# Patient Record
Sex: Male | Born: 1965
Health system: Southern US, Community
[De-identification: ages and names within clinical notes are randomized; demographics above are authoritative.]

## PROBLEM LIST (undated history)

## (undated) DIAGNOSIS — T7840XA Allergy, unspecified, initial encounter: Secondary | ICD-10-CM

## (undated) HISTORY — DX: Allergy, unspecified, initial encounter: T78.40XA

## (undated) HISTORY — PX: WISDOM TOOTH EXTRACTION: SHX21

## (undated) HISTORY — PX: WRIST SURGERY: SHX841

---

## 2002-01-06 ENCOUNTER — Emergency Department (HOSPITAL_COMMUNITY): Admission: EM | Admit: 2002-01-06 | Discharge: 2002-01-06 | Payer: Self-pay | Admitting: Emergency Medicine

## 2004-08-30 ENCOUNTER — Ambulatory Visit: Payer: Self-pay | Admitting: Oncology

## 2009-08-14 ENCOUNTER — Encounter: Payer: Self-pay | Admitting: Family Medicine

## 2009-09-18 ENCOUNTER — Ambulatory Visit: Payer: Self-pay | Admitting: Family Medicine

## 2009-09-18 DIAGNOSIS — IMO0002 Reserved for concepts with insufficient information to code with codable children: Secondary | ICD-10-CM | POA: Insufficient documentation

## 2009-09-23 ENCOUNTER — Telehealth (INDEPENDENT_AMBULATORY_CARE_PROVIDER_SITE_OTHER): Payer: Self-pay | Admitting: *Deleted

## 2009-09-24 ENCOUNTER — Ambulatory Visit: Payer: Self-pay | Admitting: Family Medicine

## 2009-10-16 ENCOUNTER — Telehealth (INDEPENDENT_AMBULATORY_CARE_PROVIDER_SITE_OTHER): Payer: Self-pay | Admitting: *Deleted

## 2009-10-16 ENCOUNTER — Telehealth: Payer: Self-pay | Admitting: Family Medicine

## 2009-10-19 ENCOUNTER — Encounter (INDEPENDENT_AMBULATORY_CARE_PROVIDER_SITE_OTHER): Payer: Self-pay | Admitting: *Deleted

## 2010-04-13 NOTE — Assessment & Plan Note (Signed)
Summary: new to estab/back pain/cbs   Vital Signs:  Patient profile:   45 year old male Height:      71 inches Weight:      188 pounds BMI:     26.32 Temp:     98.6 degrees F oral Pulse rate:   62 / minute Resp:     18 per minute BP sitting:   110 / 82  (left arm)  Vitals Entered By: Jeremy Johann CMA (September 18, 2009 10:26 AM) CC: new to establish, lower back pain x46month, Back Pain Comments REVIEWED MED LIST, PATIENT AGREED DOSE AND INSTRUCTION CORRECT    History of Present Illness:       This is a 45 year old man who presents with Back Pain.  The symptoms began 1 month ago.  Pt states he woke up with back pain June 2,2011.  No known injury.  Pt went to [facility] on [address] on June 3rd.  Pt went back 4-5 days later and PT recommended.  Pt has been going to PT.   Pt going to PT in [place]-- [facility].   The patient denies fever, chills, weakness, loss of sensation, fecal incontinence, urinary incontinence, urinary retention, dysuria, rest pain, inability to work, and inability to care for self.  The pain is located in the mid low back.  The pain began at home and suddenly.  The pain radiates to the right leg below the knee and left leg below the knee.  The pain is made worse by lying down.  The pain is made better by inactivity, muscle relaxants, and opioids.  Risk factors for serious underlying conditions include duration of pain > 1 month.    Preventive Screening-Counseling & Management  Alcohol-Tobacco     Smoking Status: never  Caffeine-Diet-Exercise     Does Patient Exercise: no  Current Medications (verified): 1)  Ibuprofen 800 Mg Tabs (Ibuprofen) .Marland Kitchen.. 1 By Mouth Every 6 Hours 2)  Vicodin 5-500 Mg Tabs (Hydrocodone-Acetaminophen) .Marland Kitchen.. 1 By Mouth Every 6 Hours As Needed 3)  Flexeril 10 Mg Tabs (Cyclobenzaprine Hcl) .Marland Kitchen.. 1 By Mouth Three Times A Day  Allergies (verified): No Known Drug Allergies  Past History:  Past medical, surgical, family and social  histories (including risk factors) reviewed for relevance to current acute and chronic problems.  Past Medical History: none  Past Surgical History: none  Family History: Reviewed history and no changes required. Family History Diabetes 1st degree relative--PGM HTN-PGM Prostate CA --maternal uncle  Social History: Reviewed history and no changes required. Married Never Smoked Alcohol use-yes Regular exercise-no Occupation: [employer] Smoking Status:  never Does Patient Exercise:  no Occupation:  employed  Review of Systems      See HPI  Physical Exam  General:  Well-developed,well-nourished,in no acute distress; alert,appropriate and cooperative throughout examination Abdomen:  Bowel sounds positive,abdomen soft and non-tender without masses, organomegaly or hernias noted. Msk:  Pain with forward bending 10-20 degrees pain with back bending + slr b/l R> L Extremities:  No clubbing, cyanosis, edema, or deformity noted with normal full range of motion of all joints.   Neurologic:  strength normal in all extremities, gait normal, and DTRs symmetrical and normal.   Psych:  Oriented X3 and normally interactive.     Impression & Recommendations:  Problem # 1:  BACK PAIN WITH RADICULOPATHY (ICD-729.2) [facility] con't  pain meds Orders: Radiology Referral (Radiology)  MRI rto as needed  Complete Medication List: 1)  Ibuprofen 800 Mg Tabs (Ibuprofen) .Marland Kitchen.. 1 by mouth every 6 hours 2)  Vicodin 5-500 Mg Tabs (Hydrocodone-acetaminophen) .Marland Kitchen.. 1 by mouth every 6 hours as needed 3)  Flexeril 10 Mg Tabs (Cyclobenzaprine hcl) .Marland Kitchen.. 1 by mouth three times a day Prescriptions: VICODIN 5-500 MG TABS (HYDROCODONE-ACETAMINOPHEN) 1 by mouth every 6 hours as needed  #30 x 0   Entered and Authorized by:   Loreen Freud DO   Signed by:   Loreen Freud DO on 09/18/2009   Method used:   Print then Give to Patient   RxID:   [id number]

## 2010-04-13 NOTE — Letter (Signed)
Summary: [facility]  [facility]   Imported By: Lanelle Bal 09/25/2009 09:58:46  _____________________________________________________________________  External Attachment:    Type:   Image     Comment:   External Document

## 2010-04-13 NOTE — Progress Notes (Signed)
Summary: REFILL REQUEST  Phone Note Refill Request Message from:  Patient on October 16, 2009 3:47 PM  Refills Requested: Medication #1:  PERCOCET 5-325 MG TABS 1-2 by mouth every 6 hours prn   Dosage confirmed as above?Dosage Confirmed   Supply Requested: 1 month   Last Refilled: 09/24/2009 PT WALKS IN AND WANTS A RX FOR HIS PERCOCET. PT IS UNABLE TO RECEIVE RX AT THIS TIME BECAUSE IT WAS FILLED ON  7/14. PT WILL COME BACK NEXT WEEK FOR RX.   Method Requested: Pick up at Office Next Appointment Scheduled: NONE Initial call taken by: Lavell Islam,  October 16, 2009 3:51 PM  Follow-up for Phone Call        last filled,last OV 09-24-09 #60.................Marland KitchenFelecia Deloach CMA  October 16, 2009 4:07 PM   Additional Follow-up for Phone Call Additional follow up Details #1::        too early--- we can fill next week Additional Follow-up by: Loreen Freud DO,  October 16, 2009 4:16 PM

## 2010-04-13 NOTE — Assessment & Plan Note (Signed)
Summary: fill out fmla/cbs   Vital Signs:  Patient profile:   45 year old male Height:      71 inches Weight:      187 pounds Temp:     97.9 degrees F oral Pulse rate:   68 / minute BP sitting:   110 / 78  (left arm)  Vitals Entered By: Jeremy Johann CMA (September 24, 2009 3:44 PM) CC: fill out forms Comments REVIEWED MED LIST, PATIENT AGREED DOSE AND INSTRUCTION CORRECT    History of Present Illness: Pt here f/u backpain.   He is no better. He is going to PT and MRI was refused.  Pt needs paperwork filled out for work.  Current Medications (verified): 1)  Ibuprofen 800 Mg Tabs (Ibuprofen) .Marland Kitchen.. 1 By Mouth Every 6 Hours 2)  Percocet 5-325 Mg Tabs (Oxycodone-Acetaminophen) .Marland Kitchen.. 1-2 By Mouth Every 6 Hours Prn 3)  Flexeril 10 Mg Tabs (Cyclobenzaprine Hcl) .Marland Kitchen.. 1 By Mouth Three Times A Day  Allergies (verified): No Known Drug Allergies  Family History: Reviewed history from 09/18/2009 and no changes required. Family History Diabetes 1st degree relative--PGM HTN-PGM Prostate CA --maternal uncle  Social History: Reviewed history from 09/18/2009 and no changes required. Married Never Smoked Alcohol use-yes Regular exercise-no Occupation: [employer]  Review of Systems      See HPI  Physical Exam  General:  Well-developed,well-nourished,in no acute distress; alert,appropriate and cooperative throughout examination Psych:  Cognition and judgment appear intact. Alert and cooperative with normal attention span and concentration. No apparent delusions, illusions, hallucinations   Impression & Recommendations:  Problem # 1:  BACK PAIN WITH RADICULOPATHY (ICD-729.2) percocet and flexeril con't pt  Orders: Orthopedic Surgeon Referral (Ortho Surgeon) disability forms filled out for pt  Complete Medication List: 1)  Ibuprofen 800 Mg Tabs (Ibuprofen) .Marland Kitchen.. 1 by mouth every 6 hours 2)  Percocet 5-325 Mg Tabs (Oxycodone-acetaminophen) .Marland Kitchen.. 1-2 by mouth every 6 hours prn 3)   Flexeril 10 Mg Tabs (Cyclobenzaprine hcl) .Marland Kitchen.. 1 by mouth three times a day  Patient Instructions: 1)  Most patients (90%) with low back pain will improve with time ( 2-6 weeks). Keep active but avoid activities that are painful. Apply moist heat and/or ice to lower back several times a day.  Prescriptions: PERCOCET 5-325 MG TABS (OXYCODONE-ACETAMINOPHEN) 1-2 by mouth every 6 hours prn  #60 x 0   Entered and Authorized by:   Loreen Freud DO   Signed by:   Loreen Freud DO on 09/24/2009   Method used:   Print then Give to Patient   RxID:   [id number] FLEXERIL 10 MG TABS (CYCLOBENZAPRINE HCL) 1 by mouth three times a day  #60 x 1   Entered and Authorized by:   Loreen Freud DO   Signed by:   Loreen Freud DO on 09/24/2009   Method used:   Print then Give to Patient   RxID:   [id number]

## 2010-04-13 NOTE — Letter (Signed)
Summary: Out of Work  [facility]  [address] [place], [place] [address]   Phone: [phone]  Fax: [phone]    October 19, 2009   Employee:  Reyne Dumas    To Whom It May Concern:   For Medical reasons, please excuse the above named employee from work for the following dates:  Start:   10-19-09  End:   10-22-09  If you need additional information, please feel free to contact our office.         Sincerely,       Janit Bern

## 2010-04-13 NOTE — Progress Notes (Signed)
Summary: lmtc 8-5 WORK??  Phone Note Call from Patient   Caller: Patient Summary of Call: PT WALKS IN AND STATES THAT HE IS SCHEDULED TO GO BACK TO WORK ON MONDAY. PT STATES HE HASN'T BEEN TO HIS APPT YET WITH THE ORTHO SURGEON. PT HAS AN APPT WITH ORTHO  ON 8/11. HE DOESN'T KNOW IF HE SHOULD GO BACK TO WORK SINCE NOTHING HAS CHANGED OR IF HE NEEDS TO WAIT.  PLEASE ADVISE WHAT HE SHOULD DO. [phone] Initial call taken by: Lavell Islam,  October 16, 2009 3:56 PM  Follow-up for Phone Call        dr lowne pls advise.................Marland KitchenFelecia Deloach CMA  October 16, 2009 4:03 PM   Additional Follow-up for Phone Call Additional follow up Details #1::        extend note until 8/11 and he will have to get note from ortho then.   Additional Follow-up by: Loreen Freud DO,  October 16, 2009 4:15 PM    Additional Follow-up for Phone Call Additional follow up Details #2::    left message to call office.................Marland KitchenFelecia Deloach CMA  October 16, 2009 4:27 PM   DISCUSS WITH PATIENT................Marland KitchenFelecia Deloach CMA  October 19, 2009 12:27 PM

## 2010-04-13 NOTE — Progress Notes (Signed)
Summary: MRI Denial  Phone Note Other Incoming   Caller: Fax from [facility] Summary of Call: [facility] denied the MRI Lumbar Spine without contrast due to the lack of  physcian treatment without results for at least 6 weeks. Will inform patient of this and see if he would like the ortho referral instead.  Initial call taken by: Harold Barban,  September 23, 2009 10:36 AM  Follow-up for Phone Call        [email or web address].Harold Barban  September 23, 2009 10:39 AM  Additional Follow-up for Phone Call Additional follow up Details #1::        Spoke with patient and offered they ortho referral or to come back and see Dr. Laury Axon. He said he would think about it and will let Dr. Laury Axon know at his appt this afternoon to go over his FMLA papers.  Additional Follow-up by: Harold Barban,  September 24, 2009 9:46 AM

## 2011-07-18 ENCOUNTER — Ambulatory Visit: Payer: Managed Care, Other (non HMO) | Admitting: Internal Medicine

## 2011-07-18 VITALS — BP 136/89 | HR 79 | Temp 99.3°F | Resp 16 | Ht 70.0 in | Wt 186.0 lb

## 2011-07-18 DIAGNOSIS — J029 Acute pharyngitis, unspecified: Secondary | ICD-10-CM

## 2011-07-18 NOTE — Progress Notes (Signed)
  Subjective:    Patient ID: William Lawrence, male    DOB: 1965-10-19, 46 y.o.   MRN: [id number]  HPIComplaining of scratchy sore throat with postnasal drainage for 2 days Did not respond to one dose of Claritin Doesn't like to take medications No cough or wheezing Not really sneezing No fever    Review of Systems     Objective:   Physical Exam Conjunctiva slightly injected TMs clear Nares boggy with clear mucus Throat injected with no purulent mucus No a.c. Or PC nodes Chest clear       Results for orders placed in visit on 07/18/11  POCT RAPID STREP A (OFFICE)      Component Value Range   Rapid Strep A Screen Negative  Negative     Assessment & Plan:  Problem #1 pharyngitis Problem #2 allergic rhinitis  Could all be viral or allergic Of all the options he chooses to try Zyrtec without anything else Followup as needed

## 2012-01-24 ENCOUNTER — Encounter: Payer: Self-pay | Admitting: Family Medicine

## 2012-01-24 ENCOUNTER — Ambulatory Visit (INDEPENDENT_AMBULATORY_CARE_PROVIDER_SITE_OTHER): Payer: 59 | Admitting: Family Medicine

## 2012-01-24 VITALS — BP 130/82 | HR 97 | Temp 98.5°F | Wt 190.4 lb

## 2012-01-24 DIAGNOSIS — J029 Acute pharyngitis, unspecified: Secondary | ICD-10-CM

## 2012-01-24 MED ORDER — AMOXICILLIN 875 MG PO TABS: 875.0000 mg | ORAL_TABLET | Freq: Two times a day (BID) | ORAL | Status: DC

## 2012-01-24 NOTE — Patient Instructions (Addendum)

## 2012-01-24 NOTE — Progress Notes (Signed)
  Subjective:     William Lawrence is a 46 y.o. male who presents for evaluation of sore throat. Associated symptoms include nasal blockage, post nasal drip, sinus and nasal congestion and sore throat. Onset of symptoms was 3 days ago, and have been gradually worsening since that time. He is drinking plenty of fluids. He has not had a recent close exposure to someone with proven streptococcal pharyngitis.  The following portions of the patient's history were reviewed and updated as appropriate: allergies, current medications, past family history, past medical history, past social history, past surgical history and problem list.  Review of Systems Pertinent items are noted in HPI.    Objective:    BP 130/82  Pulse 97  Temp 98.5 F (36.9 C) (Oral)  Wt 190 lb 6.4 oz (86.365 kg)  SpO2 97% General appearance: alert, cooperative, appears stated age and no distress Ears: normal TM's and external ear canals both ears Nose: Nares normal. Septum midline. Mucosa normal. No drainage or sinus tenderness. Throat: abnormal findings: marked oropharyngeal erythema and + exudate Neck: mild anterior cervical adenopathy, supple, symmetrical, trachea midline and thyroid not enlarged, symmetric, no tenderness/mass/nodules Lungs: clear to auscultation bilaterally Heart: S1, S2 normal  Laboratory Strep test done. Results:negative.    Assessment:    Acute pharyngitis---throat culture done -.    Plan:    Patient placed on antibiotics. Use of OTC analgesics recommended as well as salt water gargles. Patient advised of the risk of peritonsillar abscess formation. Follow up as needed.

## 2012-01-26 LAB — CULTURE, GROUP A STREP

## 2017-02-28 NOTE — Progress Notes (Signed)
[facility] [address] [place], [place] [address] [phone]  Date:  03/01/2017   Name:  William Lawrence   DOB:  Aug 23, 1965   MRN:  [id number]  PCP:  Ann Held, DO    Chief Complaint: No chief complaint on file.   History of Present Illness:  William Lawrence is a 51 y.o. very pleasant male patient who presents with the following:  Here today for a sick visit- she has seen Dr. Etter Sjogren but it has been 5 years since he was last seen by [place]. This pt was not put in as a new patient visit Today is Monday-  He noted a ST that started 10 days ago.  He used some nyquil and got better for a while, but this past Friday he went to [facility] and was seen.  He had a strep which was negative.   They also did a throat culture which was negative He notes that he continues to have some ST at night and in the early am He will have a "horrible" cough at night However he feels like the cough is coming more from his throat than his chest  He is not having any current fever or chills, but states that he did have a fever a few days ago No vomiting or diarrhea He is otherwise generally in good health  No sick contacts   Patient Active Problem List   Diagnosis Date Noted  . BACK PAIN WITH RADICULOPATHY 09/18/2009    No past medical history on file.  No past surgical history on file.  Social History   Tobacco Use  . Smoking status: Never Smoker  . Smokeless tobacco: Never Used  Substance Use Topics  . Alcohol use: Not on file  . Drug use: Not on file    Family History  Problem Relation Age of Onset  . Diabetes Paternal Grandmother   . Hypertension Paternal Grandmother   . Prostate cancer Maternal Uncle     No Known Allergies  Medication list has been reviewed and updated.  Current Outpatient Medications on File Prior to Visit  Medication Sig Dispense Refill  . fish oil-omega-3 fatty acids 1000 MG capsule Take 2 g  by mouth daily.    . Multiple Vitamin (MULTIVITAMIN) tablet Take 1 tablet by mouth daily.     No current facility-administered medications on file prior to visit.     Review of Systems:  As per HPI- otherwise negative. No fever or chills Pt notes that his BP is normally ok No sick contacts    Physical Examination: Vitals:   03/01/17 0849  BP: (!) 143/90  Pulse: 65  Resp: 18  Temp: 98.6 F (37 C)  SpO2: 97%   Vitals:   03/01/17 0849  Weight: 186 lb (84.4 kg)   Body mass index is 26.69 kg/m. Ideal Body Weight:    GEN: WDWN, NAD, Non-toxic, A & O x 3, looks well HEENT: Atraumatic, Normocephalic. Neck supple. No masses, No LAD.  Bilateral TM wnl, oropharynx normal.  Nasal cavity is inflamed and red.  PEERL,EOMI.   Ears and Nose: No external deformity. CV: RRR, No M/G/R. No JVD. No thrill. No extra heart sounds. PULM: CTA B, no wheezes, crackles, rhonchi. No retractions. No resp. distress. No accessory muscle use. ABD: S, NT, ND EXTR: No c/c/e NEURO Normal gait.  PSYCH: Normally interactive. Conversant. Not depressed or anxious appearing.  Calm demeanor.  Assessment and Plan: Pharyngitis, unspecified etiology - Plan: amoxicillin (AMOXIL) 500 MG capsule  Acute non-recurrent frontal sinusitis - Plan: amoxicillin (AMOXIL) 500 MG capsule  Here today for sick visit- new patient but was not put into a new visit slot.  Will treat him with amoxicillin for persistent pharyngitis and sinusitis He will let me know if not feeling better in the next few day- Sooner if worse.    Signed Lamar Blinks, MD

## 2017-03-01 ENCOUNTER — Encounter: Payer: Self-pay | Admitting: Family Medicine

## 2017-03-01 ENCOUNTER — Ambulatory Visit (INDEPENDENT_AMBULATORY_CARE_PROVIDER_SITE_OTHER): Payer: 59 | Admitting: Family Medicine

## 2017-03-01 VITALS — BP 143/90 | HR 65 | Temp 98.6°F | Resp 18 | Wt 186.0 lb

## 2017-03-01 DIAGNOSIS — J0110 Acute frontal sinusitis, unspecified: Secondary | ICD-10-CM

## 2017-03-01 DIAGNOSIS — J029 Acute pharyngitis, unspecified: Secondary | ICD-10-CM

## 2017-03-01 MED ORDER — AMOXICILLIN 500 MG PO CAPS: 1000.0000 mg | ORAL_CAPSULE | Freq: Two times a day (BID) | ORAL | 0 refills | Status: DC

## 2017-03-01 NOTE — Patient Instructions (Signed)
We are going to treat your sore throat and sinus infection with amoxicillin- use this as directed and let me know if you are not feeling better in the next few days- Sooner if worse.

## 2017-05-05 NOTE — Progress Notes (Signed)
Chief Complaint  Patient presents with  . Cough    HPI: William Lawrence 52 y.o. Patient comes in today for [facility] Saturday clinic for  new problem evaluation. Late  Thursday or yesterday .   Coughing.  Not feeling  That well and pressure in face.   took nyquil last night    Had chills with shakes last night for about 15 minutes   No lung  Disease  Asthma alergies .  ROS: See pertinent positives and negatives per HPI. No cp sob  Cough is dry . No hx of this  Or sig illness  .   Neg tobacco  Back myalgias no pleurisy   Children in home had flu a month ago?  No flu vaccine   No past medical history on file.  Family History  Problem Relation Age of Onset  . Diabetes Paternal Grandmother   . Hypertension Paternal Grandmother   . Prostate cancer Maternal Uncle     Social History   Socioeconomic History  . Marital status: Married    Spouse name: None  . Number of children: None  . Years of education: None  . Highest education level: None  Social Needs  . Financial resource strain: None  . Food insecurity - worry: None  . Food insecurity - inability: None  . Transportation needs - medical: None  . Transportation needs - non-medical: None  Occupational History  . None  Tobacco Use  . Smoking status: Never Smoker  . Smokeless tobacco: Never Used  Substance and Sexual Activity  . Alcohol use: None  . Drug use: None  . Sexual activity: None  Other Topics Concern  . None  Social History Narrative  . None    Outpatient Medications Prior to Visit  Medication Sig Dispense Refill  . amoxicillin (AMOXIL) 500 MG capsule Take 2 capsules (1,000 mg total) by mouth 2 (two) times daily. (Patient not taking: Reported on 05/06/2017) 40 capsule 0  . fish oil-omega-3 fatty acids 1000 MG capsule Take 2 g by mouth daily.    . Multiple Vitamin (MULTIVITAMIN) tablet Take 1 tablet by mouth daily.     No facility-administered medications prior to visit.      EXAM:  BP 110/76   Pulse 90    Temp 100 F (37.8 C) (Oral)   Wt 182 lb 1.9 oz (82.6 kg)   SpO2 98%   BMI 26.13 kg/m   Body mass index is 26.13 kg/m. WDWN in NAD  quiet respirations; mildly congested   ocass cough . Non toxic . HEENT: Normocephalic ;atraumatic , Eyes;  PERRL, EOMs  Full, lids and conjunctiva clear,,Ears: no deformities, canals nl, TM landmarks normal, Nose: no deformity or discharge but congested;face minimally tender Mouth : OP clear without lesion or edema .  Redness  Neck: Supple without adenopathy or masses or bruits Chest:  Clear to A without wheezes rales or rhonchi CV:  S1-S2 no gallops or murmurs peripheral perfusion is normal Skin :nl perfusion and no acute rashes   No results found for: WBC, HGB, HCT, PLT, GLUCOSE, CHOL, TRIG, HDL, LDLDIRECT, LDLCALC, ALT, AST, NA, K, CL, CREATININE, BUN, CO2, TSH, PSA, INR, GLUF, HGBA1C, MICROALBUR BP Readings from Last 3 Encounters:  05/06/17 110/76  03/01/17 (!) 143/90  01/24/12 130/82    ASSESSMENT AND PLAN:  Discussed the following assessment and plan:  Flu-like symptoms - Plan: POC Influenza A&B(BINAX/QUICKVUE)  Cough with fever - Plan: POC Influenza A&B(BINAX/QUICKVUE) No evidence of  pna today  Risk benefit of medication discussed.   suspect false negative  Flu screen .   Disc  signs of pna complications   dont work today .  Fu if fever  Continues or cp sob  Counseled   Expectant management.     Fu with alarm sx  -Patient advised to return or notify health care team  if  new concerns arise.  Patient Instructions  This acts like flu    Your flu test is negative    But still could be flu  Chest exam is clear today    And oxygen level is good .   We can use tamiflu   If fever not improving in the next  2 days  Or short po breath contact  For more evaluation chest x ray poss etc.    mucinex and mucinex Dm      Influenza, Adult Influenza, more commonly known as "the flu," is a viral infection that primarily affects the respiratory tract.  The respiratory tract includes organs that help you breathe, such as the lungs, nose, and throat. The flu causes many common cold symptoms, as well as a high fever and body aches. The flu spreads easily from person to person (is contagious). Getting a flu shot (influenza vaccination) every year is the best way to prevent influenza. What are the causes? Influenza is caused by a virus. You can catch the virus by:  Breathing in droplets from an infected person's cough or sneeze.  Touching something that was recently contaminated with the virus and then touching your mouth, nose, or eyes.  What increases the risk? The following factors may make you more likely to get the flu:  Not cleaning your hands frequently with soap and water or alcohol-based hand sanitizer.  Having close contact with many people during cold and flu season.  Touching your mouth, eyes, or nose without washing or sanitizing your hands first.  Not drinking enough fluids or not eating a healthy diet.  Not getting enough sleep or exercise.  Being under a high amount of stress.  Not getting a yearly (annual) flu shot.  You may be at a higher risk of complications from the flu, such as a severe lung infection (pneumonia), if you:  Are over the age of 30.  Are pregnant.  Have a weakened disease-fighting system (immune system). You may have a weakened immune system if you: ? Have HIV or AIDS. ? Are undergoing chemotherapy. ? Aretaking medicines that reduce the activity of (suppress) the immune system.  Have a long-term (chronic) illness, such as heart disease, kidney disease, diabetes, or lung disease.  Have a liver disorder.  Are obese.  Have anemia.  What are the signs or symptoms? Symptoms of this condition typically last 4-10 days and may include:  Fever.  Chills.  Headache, body aches, or muscle aches.  Sore throat.  Cough.  Runny or congested nose.  Chest discomfort and cough.  Poor  appetite.  Weakness or tiredness (fatigue).  Dizziness.  Nausea or vomiting.  How is this diagnosed? This condition may be diagnosed based on your medical history and a physical exam. Your health care provider may do a nose or throat swab test to confirm the diagnosis. How is this treated? If influenza is detected early, you can be treated with antiviral medicine that can reduce the length of your illness and the severity of your symptoms. This medicine may be given by mouth (orally) or through an IV tube that is  inserted in one of your veins. The goal of treatment is to relieve symptoms by taking care of yourself at home. This may include taking over-the-counter medicines, drinking plenty of fluids, and adding humidity to the air in your home. In some cases, influenza goes away on its own. Severe influenza or complications from influenza may be treated in a hospital. Follow these instructions at home:  Take over-the-counter and prescription medicines only as told by your health care provider.  Use a cool mist humidifier to add humidity to the air in your home. This can make breathing easier.  Rest as needed.  Drink enough fluid to keep your urine clear or pale yellow.  Cover your mouth and nose when you cough or sneeze.  Wash your hands with soap and water often, especially after you cough or sneeze. If soap and water are not available, use hand sanitizer.  Stay home from work or school as told by your health care provider. Unless you are visiting your health care provider, try to avoid leaving home until your fever has been gone for 24 hours without the use of medicine.  Keep all follow-up visits as told by your health care provider. This is important. How is this prevented?  Getting an annual flu shot is the best way to avoid getting the flu. You may get the flu shot in late summer, fall, or winter. Ask your health care provider when you should get your flu shot.  Wash your  hands often or use hand sanitizer often.  Avoid contact with people who are sick during cold and flu season.  Eat a healthy diet, drink plenty of fluids, get enough sleep, and exercise regularly. Contact a health care provider if:  You develop new symptoms.  You have: ? Chest pain. ? Diarrhea. ? A fever.  Your cough gets worse.  You produce more mucus.  You feel nauseous or you vomit. Get help right away if:  You develop shortness of breath or difficulty breathing.  Your skin or nails turn a bluish color.  You have severe pain or stiffness in your neck.  You develop a sudden headache or sudden pain in your face or ear.  You cannot stop vomiting. This information is not intended to replace advice given to you by your health care provider. Make sure you discuss any questions you have with your health care provider. Document Released: 02/26/2000 Document Revised: 08/06/2015 Document Reviewed: 12/23/2014 Elsevier Interactive Patient Education  2017 Elliston K. Panosh M.D.

## 2017-05-06 ENCOUNTER — Ambulatory Visit (INDEPENDENT_AMBULATORY_CARE_PROVIDER_SITE_OTHER): Payer: 59 | Admitting: Internal Medicine

## 2017-05-06 ENCOUNTER — Encounter: Payer: Self-pay | Admitting: Internal Medicine

## 2017-05-06 VITALS — BP 110/76 | HR 90 | Temp 100.0°F | Wt 182.1 lb

## 2017-05-06 DIAGNOSIS — R05 Cough: Secondary | ICD-10-CM | POA: Diagnosis not present

## 2017-05-06 DIAGNOSIS — R6889 Other general symptoms and signs: Secondary | ICD-10-CM

## 2017-05-06 DIAGNOSIS — R509 Fever, unspecified: Secondary | ICD-10-CM | POA: Diagnosis not present

## 2017-05-06 DIAGNOSIS — R059 Cough, unspecified: Secondary | ICD-10-CM

## 2017-05-06 LAB — POC INFLUENZA A&B (BINAX/QUICKVUE)
INFLUENZA A, POC: NEGATIVE
INFLUENZA B, POC: NEGATIVE

## 2017-05-06 MED ORDER — OSELTAMIVIR PHOSPHATE 75 MG PO CAPS: 75.0000 mg | ORAL_CAPSULE | Freq: Two times a day (BID) | ORAL | 0 refills | Status: DC

## 2017-05-06 NOTE — Patient Instructions (Addendum)
This acts like flu    Your flu test is negative    But still could be flu  Chest exam is clear today    And oxygen level is good .   We can use tamiflu   If fever not improving in the next  2 days  Or short po breath contact  For more evaluation chest x ray poss etc.    mucinex and mucinex Dm      Influenza, Adult Influenza, more commonly known as "the flu," is a viral infection that primarily affects the respiratory tract. The respiratory tract includes organs that help you breathe, such as the lungs, nose, and throat. The flu causes many common cold symptoms, as well as a high fever and body aches. The flu spreads easily from person to person (is contagious). Getting a flu shot (influenza vaccination) every year is the best way to prevent influenza. What are the causes? Influenza is caused by a virus. You can catch the virus by:  Breathing in droplets from an infected person's cough or sneeze.  Touching something that was recently contaminated with the virus and then touching your mouth, nose, or eyes.  What increases the risk? The following factors may make you more likely to get the flu:  Not cleaning your hands frequently with soap and water or alcohol-based hand sanitizer.  Having close contact with many people during cold and flu season.  Touching your mouth, eyes, or nose without washing or sanitizing your hands first.  Not drinking enough fluids or not eating a healthy diet.  Not getting enough sleep or exercise.  Being under a high amount of stress.  Not getting a yearly (annual) flu shot.  You may be at a higher risk of complications from the flu, such as a severe lung infection (pneumonia), if you:  Are over the age of 44.  Are pregnant.  Have a weakened disease-fighting system (immune system). You may have a weakened immune system if you: ? Have HIV or AIDS. ? Are undergoing chemotherapy. ? Aretaking medicines that reduce the activity of (suppress) the  immune system.  Have a long-term (chronic) illness, such as heart disease, kidney disease, diabetes, or lung disease.  Have a liver disorder.  Are obese.  Have anemia.  What are the signs or symptoms? Symptoms of this condition typically last 4-10 days and may include:  Fever.  Chills.  Headache, body aches, or muscle aches.  Sore throat.  Cough.  Runny or congested nose.  Chest discomfort and cough.  Poor appetite.  Weakness or tiredness (fatigue).  Dizziness.  Nausea or vomiting.  How is this diagnosed? This condition may be diagnosed based on your medical history and a physical exam. Your health care provider may do a nose or throat swab test to confirm the diagnosis. How is this treated? If influenza is detected early, you can be treated with antiviral medicine that can reduce the length of your illness and the severity of your symptoms. This medicine may be given by mouth (orally) or through an IV tube that is inserted in one of your veins. The goal of treatment is to relieve symptoms by taking care of yourself at home. This may include taking over-the-counter medicines, drinking plenty of fluids, and adding humidity to the air in your home. In some cases, influenza goes away on its own. Severe influenza or complications from influenza may be treated in a hospital. Follow these instructions at home:  Take over-the-counter and prescription medicines  only as told by your health care provider.  Use a cool mist humidifier to add humidity to the air in your home. This can make breathing easier.  Rest as needed.  Drink enough fluid to keep your urine clear or pale yellow.  Cover your mouth and nose when you cough or sneeze.  Wash your hands with soap and water often, especially after you cough or sneeze. If soap and water are not available, use hand sanitizer.  Stay home from work or school as told by your health care provider. Unless you are visiting your health  care provider, try to avoid leaving home until your fever has been gone for 24 hours without the use of medicine.  Keep all follow-up visits as told by your health care provider. This is important. How is this prevented?  Getting an annual flu shot is the best way to avoid getting the flu. You may get the flu shot in late summer, fall, or winter. Ask your health care provider when you should get your flu shot.  Wash your hands often or use hand sanitizer often.  Avoid contact with people who are sick during cold and flu season.  Eat a healthy diet, drink plenty of fluids, get enough sleep, and exercise regularly. Contact a health care provider if:  You develop new symptoms.  You have: ? Chest pain. ? Diarrhea. ? A fever.  Your cough gets worse.  You produce more mucus.  You feel nauseous or you vomit. Get help right away if:  You develop shortness of breath or difficulty breathing.  Your skin or nails turn a bluish color.  You have severe pain or stiffness in your neck.  You develop a sudden headache or sudden pain in your face or ear.  You cannot stop vomiting. This information is not intended to replace advice given to you by your health care provider. Make sure you discuss any questions you have with your health care provider. Document Released: 02/26/2000 Document Revised: 08/06/2015 Document Reviewed: 12/23/2014 Elsevier Interactive Patient Education  2017 [employer].

## 2018-08-10 ENCOUNTER — Ambulatory Visit (HOSPITAL_BASED_OUTPATIENT_CLINIC_OR_DEPARTMENT_OTHER)
Admission: RE | Admit: 2018-08-10 | Discharge: 2018-08-10 | Disposition: A | Discharge status: Home / Self Care | Payer: No Typology Code available for payment source | Source: Ambulatory Visit | Attending: Family Medicine | Admitting: Family Medicine

## 2018-08-10 ENCOUNTER — Other Ambulatory Visit: Payer: Self-pay

## 2018-08-10 ENCOUNTER — Encounter: Payer: Self-pay | Admitting: Family Medicine

## 2018-08-10 ENCOUNTER — Ambulatory Visit (HOSPITAL_BASED_OUTPATIENT_CLINIC_OR_DEPARTMENT_OTHER): Payer: 59

## 2018-08-10 ENCOUNTER — Ambulatory Visit (INDEPENDENT_AMBULATORY_CARE_PROVIDER_SITE_OTHER): Payer: 59 | Admitting: Family Medicine

## 2018-08-10 DIAGNOSIS — M79671 Pain in right foot: Secondary | ICD-10-CM | POA: Diagnosis not present

## 2018-08-10 DIAGNOSIS — S99921A Unspecified injury of right foot, initial encounter: Secondary | ICD-10-CM | POA: Diagnosis not present

## 2018-08-10 NOTE — Addendum Note (Signed)
Addended by: Roma Schanz R on: 08/10/2018 10:22 AM   Modules accepted: Orders

## 2018-08-10 NOTE — Progress Notes (Addendum)
Virtual Visit via Video Note  I connected with William Lawrence on 08/10/18 at  9:30 AM EDT by a video enabled telemedicine application and verified that I am speaking with the correct person using two identifiers.  Location: Patient: in car Provider: office   I discussed the limitations of evaluation and management by telemedicine and the availability of in person appointments. The patient expressed understanding and agreed to proceed.  History of Present Illness: Pt is in his car-- he dropped a heavy wooden stuffed chair on his R foot yesterday.  He states he is unable to put weight on it and it is red and swollen on the top of his foot    Observations/Objective: No vitals obtained due to pt being in his car r foot-- + swelling on top of foot and some errythema  Assessment and Plan:  1. Right foot pain Check xray Post op boot and ace  Consider ortho if xray abnormal or symptoms do not improve - DG Foot Complete Right; Future   Follow Up Instructions:    I discussed the assessment and treatment plan with the patient. The patient was provided an opportunity to ask questions and all were answered. The patient agreed with the plan and demonstrated an understanding of the instructions.   The patient was advised to call back or seek an in-person evaluation if the symptoms worsen or if the condition fails to improve as anticipated.  I provided 15 minutes of non-face-to-face time during this encounter.   Ann Held, DO

## 2018-09-12 DIAGNOSIS — S9031XA Contusion of right foot, initial encounter: Secondary | ICD-10-CM | POA: Insufficient documentation

## 2019-03-15 DIAGNOSIS — I10 Essential (primary) hypertension: Secondary | ICD-10-CM

## 2019-03-15 HISTORY — DX: Essential (primary) hypertension: I10

## 2019-05-27 ENCOUNTER — Encounter: Payer: Self-pay | Admitting: Family Medicine

## 2019-05-27 ENCOUNTER — Ambulatory Visit (INDEPENDENT_AMBULATORY_CARE_PROVIDER_SITE_OTHER): Payer: 59 | Admitting: Family Medicine

## 2019-05-27 ENCOUNTER — Other Ambulatory Visit: Payer: Self-pay

## 2019-05-27 VITALS — BP 150/120 | HR 73 | Temp 97.3°F | Resp 18 | Ht 70.0 in | Wt 183.2 lb

## 2019-05-27 DIAGNOSIS — Z0000 Encounter for general adult medical examination without abnormal findings: Secondary | ICD-10-CM

## 2019-05-27 DIAGNOSIS — Z1211 Encounter for screening for malignant neoplasm of colon: Secondary | ICD-10-CM

## 2019-05-27 DIAGNOSIS — I10 Essential (primary) hypertension: Secondary | ICD-10-CM | POA: Diagnosis not present

## 2019-05-27 LAB — CBC WITH DIFFERENTIAL/PLATELET
Basophils Absolute: 0 10*3/uL (ref 0.0–0.1)
Basophils Relative: 0.8 % (ref 0.0–3.0)
Eosinophils Absolute: 0.1 10*3/uL (ref 0.0–0.7)
Eosinophils Relative: 1.4 % (ref 0.0–5.0)
HCT: 42.1 % (ref 39.0–52.0)
Hemoglobin: 14.2 g/dL (ref 13.0–17.0)
Lymphocytes Relative: 41.1 % (ref 12.0–46.0)
Lymphs Abs: 1.7 10*3/uL (ref 0.7–4.0)
MCHC: 33.8 g/dL (ref 30.0–36.0)
MCV: 95.1 fl (ref 78.0–100.0)
Monocytes Absolute: 0.5 10*3/uL (ref 0.1–1.0)
Monocytes Relative: 11.6 % (ref 3.0–12.0)
Neutro Abs: 1.8 10*3/uL (ref 1.4–7.7)
Neutrophils Relative %: 45.1 % (ref 43.0–77.0)
Platelets: 176 10*3/uL (ref 150.0–400.0)
RBC: 4.43 Mil/uL (ref 4.22–5.81)
RDW: 12.9 % (ref 11.5–15.5)
WBC: 4.1 10*3/uL (ref 4.0–10.5)

## 2019-05-27 LAB — LIPID PANEL
Cholesterol: 190 mg/dL (ref 0–200)
HDL: 45.3 mg/dL (ref >39.00)
LDL Cholesterol: 120 mg/dL — ABNORMAL HIGH (ref 0–99)
NonHDL: 144.99
Total CHOL/HDL Ratio: 4
Triglycerides: 126 mg/dL (ref 0.0–149.0)
VLDL: 25.2 mg/dL (ref 0.0–40.0)

## 2019-05-27 LAB — PSA: PSA: 1.91 ng/mL (ref 0.10–4.00)

## 2019-05-27 LAB — COMPREHENSIVE METABOLIC PANEL
ALT: 43 U/L (ref 0–53)
AST: 25 U/L (ref 0–37)
Albumin: 4.3 g/dL (ref 3.5–5.2)
Alkaline Phosphatase: 101 U/L (ref 39–117)
BUN: 18 mg/dL (ref 6–23)
CO2: 28 mEq/L (ref 19–32)
Calcium: 9.4 mg/dL (ref 8.4–10.5)
Chloride: 103 mEq/L (ref 96–112)
Creatinine, Ser: 1.17 mg/dL (ref 0.40–1.50)
GFR: 78.43 mL/min (ref >60.00)
Glucose, Bld: 86 mg/dL (ref 70–99)
Potassium: 3.7 mEq/L (ref 3.5–5.1)
Sodium: 138 mEq/L (ref 135–145)
Total Bilirubin: 0.6 mg/dL (ref 0.2–1.2)
Total Protein: 7.4 g/dL (ref 6.0–8.3)

## 2019-05-27 LAB — TSH: TSH: 1.62 u[IU]/mL (ref 0.35–4.50)

## 2019-05-27 MED ORDER — METOPROLOL SUCCINATE ER 50 MG PO TB24: 50.0000 mg | ORAL_TABLET | Freq: Every day | ORAL | 3 refills | Status: DC

## 2019-05-27 NOTE — Patient Instructions (Addendum)
DASH Eating Plan DASH stands for "Dietary Approaches to Stop Hypertension." The DASH eating plan is a healthy eating plan that has been shown to reduce high blood pressure (hypertension). It may also reduce your risk for type 2 diabetes, heart disease, and stroke. The DASH eating plan may also help with weight loss. What are tips for following this plan?  General guidelines  Avoid eating more than 2,300 mg (milligrams) of salt (sodium) a day. If you have hypertension, you may need to reduce your sodium intake to 1,500 mg a day.  Limit alcohol intake to no more than 1 drink a day for nonpregnant women and 2 drinks a day for men. One drink equals 12 oz of beer, 5 oz of wine, or 1 oz of hard liquor.  Work with your health care provider to maintain a healthy body weight or to lose weight. Ask what an ideal weight is for you.  Get at least 30 minutes of exercise that causes your heart to beat faster (aerobic exercise) most days of the week. Activities may include walking, swimming, or biking.  Work with your health care provider or diet and nutrition specialist (dietitian) to adjust your eating plan to your individual calorie needs. Reading food labels   Check food labels for the amount of sodium per serving. Choose foods with less than 5 percent of the Daily Value of sodium. Generally, foods with less than 300 mg of sodium per serving fit into this eating plan.  To find whole grains, look for the word "whole" as the first word in the ingredient list. Shopping  Buy products labeled as "low-sodium" or "no salt added."  Buy fresh foods. Avoid canned foods and premade or frozen meals. Cooking  Avoid adding salt when cooking. Use salt-free seasonings or herbs instead of table salt or sea salt. Check with your health care provider or pharmacist before using salt substitutes.  Do not fry foods. Cook foods using healthy methods such as baking, boiling, grilling, and broiling instead.  Cook with  heart-healthy oils, such as olive, canola, soybean, or sunflower oil. Meal planning  Eat a balanced diet that includes: ? 5 or more servings of fruits and vegetables each day. At each meal, try to fill half of your plate with fruits and vegetables. ? Up to 6-8 servings of whole grains each day. ? Less than 6 oz of lean meat, poultry, or fish each day. A 3-oz serving of meat is about the same size as a deck of cards. One egg equals 1 oz. ? 2 servings of low-fat dairy each day. ? A serving of nuts, seeds, or beans 5 times each week. ? Heart-healthy fats. Healthy fats called Omega-3 fatty acids are found in foods such as flaxseeds and coldwater fish, like sardines, salmon, and mackerel.  Limit how much you eat of the following: ? Canned or prepackaged foods. ? Food that is high in trans fat, such as fried foods. ? Food that is high in saturated fat, such as fatty meat. ? Sweets, desserts, sugary drinks, and other foods with added sugar. ? Full-fat dairy products.  Do not salt foods before eating.  Try to eat at least 2 vegetarian meals each week.  Eat more home-cooked food and less restaurant, buffet, and fast food.  When eating at a restaurant, ask that your food be prepared with less salt or no salt, if possible. What foods are recommended? The items listed may not be a complete list. Talk with your dietitian about   what dietary choices are best for you. Grains Whole-grain or whole-wheat bread. Whole-grain or whole-wheat pasta. Brown rice. Oatmeal. Quinoa. Bulgur. Whole-grain and low-sodium cereals. Pita bread. Low-fat, low-sodium crackers. Whole-wheat flour tortillas. Vegetables Fresh or frozen vegetables (raw, steamed, roasted, or grilled). Low-sodium or reduced-sodium tomato and vegetable juice. Low-sodium or reduced-sodium tomato sauce and tomato paste. Low-sodium or reduced-sodium canned vegetables. Fruits All fresh, dried, or frozen fruit. Canned fruit in natural juice (without  added sugar). Meat and other protein foods Skinless chicken or turkey. Ground chicken or turkey. Pork with fat trimmed off. Fish and seafood. Egg whites. Dried beans, peas, or lentils. Unsalted nuts, nut butters, and seeds. Unsalted canned beans. Lean cuts of beef with fat trimmed off. Low-sodium, lean deli meat. Dairy Low-fat (1%) or fat-free (skim) milk. Fat-free, low-fat, or reduced-fat cheeses. Nonfat, low-sodium ricotta or cottage cheese. Low-fat or nonfat yogurt. Low-fat, low-sodium cheese. Fats and oils Soft margarine without trans fats. Vegetable oil. Low-fat, reduced-fat, or light mayonnaise and salad dressings (reduced-sodium). Canola, safflower, olive, soybean, and sunflower oils. Avocado. Seasoning and other foods Herbs. Spices. Seasoning mixes without salt. Unsalted popcorn and pretzels. Fat-free sweets. What foods are not recommended? The items listed may not be a complete list. Talk with your dietitian about what dietary choices are best for you. Grains Baked goods made with fat, such as croissants, muffins, or some breads. Dry pasta or rice meal packs. Vegetables Creamed or fried vegetables. Vegetables in a cheese sauce. Regular canned vegetables (not low-sodium or reduced-sodium). Regular canned tomato sauce and paste (not low-sodium or reduced-sodium). Regular tomato and vegetable juice (not low-sodium or reduced-sodium). Pickles. Olives. Fruits Canned fruit in a light or heavy syrup. Fried fruit. Fruit in cream or butter sauce. Meat and other protein foods Fatty cuts of meat. Ribs. Fried meat. Bacon. Sausage. Bologna and other processed lunch meats. Salami. Fatback. Hotdogs. Bratwurst. Salted nuts and seeds. Canned beans with added salt. Canned or smoked fish. Whole eggs or egg yolks. Chicken or turkey with skin. Dairy Whole or 2% milk, cream, and half-and-half. Whole or full-fat cream cheese. Whole-fat or sweetened yogurt. Full-fat cheese. Nondairy creamers. Whipped toppings.  Processed cheese and cheese spreads. Fats and oils Butter. Stick margarine. Lard. Shortening. Ghee. Bacon fat. Tropical oils, such as coconut, palm kernel, or palm oil. Seasoning and other foods Salted popcorn and pretzels. Onion salt, garlic salt, seasoned salt, table salt, and sea salt. Worcestershire sauce. Tartar sauce. Barbecue sauce. Teriyaki sauce. Soy sauce, including reduced-sodium. Steak sauce. Canned and packaged gravies. Fish sauce. Oyster sauce. Cocktail sauce. Horseradish that you find on the shelf. Ketchup. Mustard. Meat flavorings and tenderizers. Bouillon cubes. Hot sauce and Tabasco sauce. Premade or packaged marinades. Premade or packaged taco seasonings. Relishes. Regular salad dressings. Where to find more information:  National Heart, Lung, and Blood Institute: www.nhlbi.nih.gov  American Heart Association: www.heart.org Summary  The DASH eating plan is a healthy eating plan that has been shown to reduce high blood pressure (hypertension). It may also reduce your risk for type 2 diabetes, heart disease, and stroke.  With the DASH eating plan, you should limit salt (sodium) intake to 2,300 mg a day. If you have hypertension, you may need to reduce your sodium intake to 1,500 mg a day.  When on the DASH eating plan, aim to eat more fresh fruits and vegetables, whole grains, lean proteins, low-fat dairy, and heart-healthy fats.  Work with your health care provider or diet and nutrition specialist (dietitian) to adjust your eating plan to your   individual calorie needs. This information is not intended to replace advice given to you by your health care provider. Make sure you discuss any questions you have with your health care provider. Document Revised: 02/10/2017 Document Reviewed: 02/22/2016 Elsevier Patient Education  2020 Elsevier Inc.   Preventive Care 40-64 Years Old, Male Preventive care refers to lifestyle choices and visits with your health care provider that can  promote health and wellness. This includes:  A yearly physical exam. This is also called an annual well check.  Regular dental and eye exams.  Immunizations.  Screening for certain conditions.  Healthy lifestyle choices, such as eating a healthy diet, getting regular exercise, not using drugs or products that contain nicotine and tobacco, and limiting alcohol use. What can I expect for my preventive care visit? Physical exam Your health care provider will check:  Height and weight. These may be used to calculate body mass index (BMI), which is a measurement that tells if you are at a healthy weight.  Heart rate and blood pressure.  Your skin for abnormal spots. Counseling Your health care provider may ask you questions about:  Alcohol, tobacco, and drug use.  Emotional well-being.  Home and relationship well-being.  Sexual activity.  Eating habits.  Work and work environment. What immunizations do I need?  Influenza (flu) vaccine  This is recommended every year. Tetanus, diphtheria, and pertussis (Tdap) vaccine  You may need a Td booster every 10 years. Varicella (chickenpox) vaccine  You may need this vaccine if you have not already been vaccinated. Zoster (shingles) vaccine  You may need this after age 60. Measles, mumps, and rubella (MMR) vaccine  You may need at least one dose of MMR if you were born in 1957 or later. You may also need a second dose. Pneumococcal conjugate (PCV13) vaccine  You may need this if you have certain conditions and were not previously vaccinated. Pneumococcal polysaccharide (PPSV23) vaccine  You may need one or two doses if you smoke cigarettes or if you have certain conditions. Meningococcal conjugate (MenACWY) vaccine  You may need this if you have certain conditions. Hepatitis A vaccine  You may need this if you have certain conditions or if you travel or work in places where you may be exposed to hepatitis A. Hepatitis  B vaccine  You may need this if you have certain conditions or if you travel or work in places where you may be exposed to hepatitis B. Haemophilus influenzae type b (Hib) vaccine  You may need this if you have certain risk factors. Human papillomavirus (HPV) vaccine  If recommended by your health care provider, you may need three doses over 6 months. You may receive vaccines as individual doses or as more than one vaccine together in one shot (combination vaccines). Talk with your health care provider about the risks and benefits of combination vaccines. What tests do I need? Blood tests  Lipid and cholesterol levels. These may be checked every 5 years, or more frequently if you are over 50 years old.  Hepatitis C test.  Hepatitis B test. Screening  Lung cancer screening. You may have this screening every year starting at age 55 if you have a 30-pack-year history of smoking and currently smoke or have quit within the past 15 years.  Prostate cancer screening. Recommendations will vary depending on your family history and other risks.  Colorectal cancer screening. All adults should have this screening starting at age 50 and continuing until age 75. Your health   care provider may recommend screening at age 45 if you are at increased risk. You will have tests every 1-10 years, depending on your results and the type of screening test.  Diabetes screening. This is done by checking your blood sugar (glucose) after you have not eaten for a while (fasting). You may have this done every 1-3 years.  Sexually transmitted disease (STD) testing. Follow these instructions at home: Eating and drinking  Eat a diet that includes fresh fruits and vegetables, whole grains, lean protein, and low-fat dairy products.  Take vitamin and mineral supplements as recommended by your health care provider.  Do not drink alcohol if your health care provider tells you not to drink.  If you drink  alcohol: ? Limit how much you have to 0-2 drinks a day. ? Be aware of how much alcohol is in your drink. In the [place] one drink equals one 12 oz bottle of beer (355 mL), one 5 oz glass of wine (148 mL), or one 1 oz glass of hard liquor (44 mL). Lifestyle  Take daily care of your teeth and gums.  Stay active. Exercise for at least 30 minutes on 5 or more days each week.  Do not use any products that contain nicotine or tobacco, such as cigarettes, e-cigarettes, and chewing tobacco. If you need help quitting, ask your health care provider.  If you are sexually active, practice safe sex. Use a condom or other form of protection to prevent STIs (sexually transmitted infections).  Talk with your health care provider about taking a low-dose aspirin every day starting at age 50. What's next?  Go to your health care provider once a year for a well check visit.  Ask your health care provider how often you should have your eyes and teeth checked.  Stay up to date on all vaccines. This information is not intended to replace advice given to you by your health care provider. Make sure you discuss any questions you have with your health care provider. Document Revised: 02/22/2018 Document Reviewed: 02/22/2018 Elsevier Patient Education  2020 Elsevier Inc.  

## 2019-05-27 NOTE — Assessment & Plan Note (Addendum)
New--- Poorly controlled will alter medications, encouraged DASH diet, minimize caffeine and obtain adequate sleep. Report concerning symptoms and follow up as directed and as needed

## 2019-05-27 NOTE — Assessment & Plan Note (Signed)
ghm utd Check labs See AVS 

## 2019-05-27 NOTE — Progress Notes (Signed)
Patient ID: William Lawrence, male    DOB: 11/30/1965  Age: 54 y.o. MRN: [id number]    Subjective:  Subjective  HPI William Lawrence presents for cpe and labs   Review of Systems  Constitutional: Negative for appetite change, diaphoresis, fatigue and unexpected weight change.  Eyes: Negative for pain, redness and visual disturbance.  Respiratory: Negative for cough, chest tightness, shortness of breath and wheezing.   Cardiovascular: Negative for chest pain, palpitations and leg swelling.  Endocrine: Negative for cold intolerance, heat intolerance, polydipsia, polyphagia and polyuria.  Genitourinary: Negative for difficulty urinating, dysuria and frequency.  Neurological: Negative for dizziness, light-headedness, numbness and headaches.    History No past medical history on file.  He has no past surgical history on file.   His family history includes Diabetes in his paternal grandmother; Hypertension in his brother, father, mother, and paternal grandmother; Prostate cancer in his maternal uncle.He reports that he has never smoked. He has never used smokeless tobacco. No history on file for alcohol and drug.  Current Outpatient Medications on File Prior to Visit  Medication Sig Dispense Refill  . fish oil-omega-3 fatty acids 1000 MG capsule Take 2 g by mouth daily.    . Multiple Vitamin (MULTIVITAMIN) tablet Take 1 tablet by mouth daily.     No current facility-administered medications on file prior to visit.     Objective:  Objective  Physical Exam Vitals and nursing note reviewed.  Constitutional:      General: He is not in acute distress.    Appearance: He is well-developed. He is not diaphoretic.  HENT:     Head: Normocephalic and atraumatic.     Right Ear: External ear normal.     Left Ear: External ear normal.     Nose: Nose normal.   Mouth/Throat:     Pharynx: No oropharyngeal exudate.  Eyes:     General:        Right eye: No discharge.        Left eye: No discharge.     Conjunctiva/sclera: Conjunctivae normal.     Pupils: Pupils are equal, round, and reactive to light.  Neck:     Thyroid: No thyromegaly.     Vascular: No JVD.  Cardiovascular:     Rate and Rhythm: Normal rate and regular rhythm.     Heart sounds: No murmur. No friction rub. No gallop.   Pulmonary:     Effort: Pulmonary effort is normal. No respiratory distress.     Breath sounds: Normal breath sounds. No wheezing or rales.  Chest:     Chest wall: No tenderness.  Abdominal:     General: Bowel sounds are normal. There is no distension.     Palpations: Abdomen is soft. There is no mass.     Tenderness: There is no abdominal tenderness. There is no guarding or rebound.  Genitourinary:    Comments: Pt refused gu exam Musculoskeletal:        General: No tenderness. Normal range of motion.     Cervical back: Normal range of motion and neck supple.  Lymphadenopathy:  Cervical: No cervical adenopathy.  Skin:    General: Skin is warm and dry.     Coloration: Skin is not pale.     Findings: No erythema or rash.  Neurological:     Mental Status: He is alert and oriented to person, place, and time.     Motor: No abnormal muscle tone.     Deep Tendon Reflexes: Reflexes are normal and symmetric. Reflexes normal.  Psychiatric:        Behavior: Behavior normal.        Thought Content: Thought content normal.        Judgment: Judgment normal.    BP (!) 150/120   Pulse 73   Temp (!) 97.3 F (36.3 C) (Temporal)   Resp 18   Ht 5\' 10"  (1.778 m)   Wt 183 lb 3.2 oz (83.1 kg)   SpO2 99%   BMI 26.29 kg/m  Wt Readings from Last 3 Encounters:  05/27/19 183 lb 3.2 oz (83.1 kg)  05/06/17 182 lb 1.9 oz (82.6 kg)  03/01/17 186 lb (84.4 kg)     No results found for: WBC, HGB, HCT, PLT, GLUCOSE, CHOL, TRIG, HDL, LDLDIRECT, LDLCALC, ALT, AST, NA, K,  CL, CREATININE, BUN, CO2, TSH, PSA, INR, GLUF, HGBA1C, MICROALBUR  DG Foot Complete Right  Result Date: 08/10/2018 CLINICAL DATA:  Anterior foot pain after dropping a chair on it yesterday. EXAM: RIGHT FOOT COMPLETE - 3+ VIEW COMPARISON:  None. FINDINGS: There is no evidence of fracture or dislocation. There is no evidence of arthropathy or other focal bone abnormality. Soft tissues are unremarkable. IMPRESSION: Negative. Electronically Signed   By: Titus Dubin M.D.   On: 08/10/2018 10:24     Assessment & Plan:  Plan  I have discontinued Chrystian Knutzen's amoxicillin and oseltamivir. I am also having him start on metoprolol succinate. Additionally, I am having him maintain his fish oil-omega-3 fatty acids and multivitamin.  Meds ordered this encounter  Medications  . metoprolol succinate (TOPROL-XL) 50 MG 24 hr tablet    Sig: Take 1 tablet (50 mg total) by mouth daily. Take with or immediately following a meal.    Dispense:  30 tablet    Refill:  3    Problem List Items Addressed This Visit      Unprioritized   Essential hypertension    New--- Poorly controlled will alter medications, encouraged DASH diet, minimize caffeine and obtain adequate sleep. Report concerning symptoms and follow up as directed and as needed      Relevant Medications   metoprolol succinate (TOPROL-XL) 50 MG 24 hr tablet   Preventative health care - Primary    ghm utd Check labs  See AVS      Relevant Orders   Lipid panel   CBC with Differential/Platelet   TSH   PSA   Comprehensive metabolic panel      Follow-up: Return in 3 weeks (on 06/17/2019), or if symptoms worsen or fail to improve, for hypertension.  Ann Held, DO

## 2019-05-29 ENCOUNTER — Other Ambulatory Visit: Payer: Self-pay | Admitting: Family Medicine

## 2019-05-29 DIAGNOSIS — E785 Hyperlipidemia, unspecified: Secondary | ICD-10-CM

## 2019-05-30 ENCOUNTER — Ambulatory Visit: Payer: 59 | Attending: Internal Medicine

## 2019-05-30 DIAGNOSIS — Z23 Encounter for immunization: Secondary | ICD-10-CM

## 2019-05-30 NOTE — Progress Notes (Signed)
   Covid-[facility] Vaccination [facility]  Name:  Teoman Zupon    MRN: [id number] DOB: 24-Feb-1966  05/30/2019  Mr. Deguzman was observed post Covid-19 immunization for 15 minutes without incident. He was provided with Vaccine Information Sheet and instruction to access the V-Safe system.   Mr. Adamczyk was instructed to call 911 with any severe reactions post vaccine: Marland Kitchen Difficulty breathing  . Swelling of face and throat  . A fast heartbeat  . A bad rash all over body  . Dizziness and weakness   Immunizations Administered    Name Date Dose VIS Date Route   Pfizer COVID-19 Vaccine 05/30/2019 12:59 PM 0.3 mL 02/22/2019 Intramuscular   Manufacturer: [place]   Lot: [id number]   [place]: [id number]

## 2019-06-17 ENCOUNTER — Encounter: Payer: Self-pay | Admitting: Family Medicine

## 2019-06-17 ENCOUNTER — Ambulatory Visit (INDEPENDENT_AMBULATORY_CARE_PROVIDER_SITE_OTHER): Payer: 59 | Admitting: Family Medicine

## 2019-06-17 ENCOUNTER — Other Ambulatory Visit: Payer: Self-pay

## 2019-06-17 VITALS — BP 140/100 | HR 65 | Temp 98.7°F | Resp 16 | Ht 70.0 in | Wt 185.0 lb

## 2019-06-17 DIAGNOSIS — I10 Essential (primary) hypertension: Secondary | ICD-10-CM | POA: Diagnosis not present

## 2019-06-17 MED ORDER — METOPROLOL SUCCINATE ER 100 MG PO TB24: 100.0000 mg | ORAL_TABLET | Freq: Every day | ORAL | 3 refills | Status: DC

## 2019-06-17 NOTE — Progress Notes (Signed)
Patient ID: William Lawrence, male    DOB: 1965/09/10  Age: 54 y.o. MRN: [id number]    Subjective:  Subjective  HPI William Lawrence presents for bp f/u -- no complaints  No HA, cp, sob, fatigue   Review of Systems  Constitutional: Negative for chills and fever.  HENT: Negative for congestion and hearing loss.   Eyes: Negative for discharge.  Respiratory: Negative for cough and shortness of breath.   Cardiovascular: Negative for chest pain, palpitations and leg swelling.  Gastrointestinal: Negative for abdominal pain, blood in stool, constipation, diarrhea, nausea and vomiting.  Genitourinary: Negative for dysuria, frequency, hematuria and urgency.  Musculoskeletal: Negative for back pain and myalgias.  Skin: Negative for rash.  Allergic/Immunologic: Negative for environmental allergies.  Neurological: Negative for dizziness, weakness and headaches.  Hematological: Does not bruise/bleed easily.  Psychiatric/Behavioral: Negative for suicidal ideas. The patient is not nervous/anxious.     History Past Medical History:  Diagnosis Date  . Hypertension 2021    He has no past surgical history on file.   His family history includes Diabetes in his paternal grandmother; Hypertension in his brother, father, mother, and paternal grandmother; Prostate cancer in his maternal uncle.He reports that he has never smoked. He has never used smokeless tobacco. No history on file for alcohol and drug.  Current Outpatient Medications on File Prior to Visit  Medication Sig Dispense Refill  . fish oil-omega-3 fatty acids 1000 MG capsule Take 2 g by mouth daily.    . Multiple Vitamin (MULTIVITAMIN) tablet Take 1 tablet by mouth daily.     No current facility-administered medications on file prior to visit.     Objective:  Objective  Physical Exam Vitals and nursing note reviewed.  Constitutional:      General: He is sleeping.     Appearance: He is well-developed.  HENT:     Head: Normocephalic and  atraumatic.  Eyes:     Pupils: Pupils are equal, round, and reactive to light.  Neck:     Thyroid: No thyromegaly.  Cardiovascular:     Rate and Rhythm: Normal rate and regular rhythm.     Heart sounds: No murmur.  Pulmonary:     Effort: Pulmonary effort is normal. No respiratory distress.     Breath sounds: Normal breath sounds. No wheezing or rales.  Chest:     Chest wall: No tenderness.  Musculoskeletal:        General: No tenderness.     Cervical back: Normal range of motion and neck supple.  Skin:    General: Skin is warm and dry.  Neurological:     Mental Status: He is oriented to person, place, and time.  Psychiatric:        Behavior: Behavior normal.        Thought Content: Thought content normal.        Judgment: Judgment normal.    BP (!) 140/100   Pulse 65   Temp 98.7 F (37.1 C) (Temporal)   Resp 16   Ht 5\' 10"  (1.778 m)   Wt 185 lb (83.9 kg)   SpO2 100%   BMI 26.54 kg/m  Wt Readings from Last 3 Encounters:  06/17/19 185 lb (83.9 kg)  05/27/19 183 lb 3.2 oz (83.1 kg)  05/06/17 182 lb 1.9 oz (82.6 kg)     Lab Results  Component Value Date   WBC 4.1 05/27/2019   HGB 14.2 05/27/2019   HCT 42.1 05/27/2019   PLT 176.0 05/27/2019  GLUCOSE 86 05/27/2019   CHOL 190 05/27/2019   TRIG 126.0 05/27/2019   HDL 45.30 05/27/2019   LDLCALC 120 (H) 05/27/2019   ALT 43 05/27/2019   AST 25 05/27/2019   NA 138 05/27/2019   K 3.7 05/27/2019   CL 103 05/27/2019   CREATININE 1.17 05/27/2019   BUN 18 05/27/2019   CO2 28 05/27/2019   TSH 1.62 05/27/2019   PSA 1.91 05/27/2019    DG Foot Complete Right  Result Date: 08/10/2018 CLINICAL DATA:  Anterior foot pain after dropping a chair on it yesterday. EXAM: RIGHT FOOT COMPLETE - 3+ VIEW COMPARISON:  None. FINDINGS: There is no evidence of fracture or dislocation. There is no evidence of arthropathy or other focal bone abnormality. Soft tissues are unremarkable. IMPRESSION: Negative. Electronically Signed   By:  William Lawrence M.D.   On: 08/10/2018 10:24     Assessment & Plan:  Plan  I have discontinued Taym Scherman's metoprolol succinate. I am also having him start on metoprolol succinate. Additionally, I am having him maintain his fish oil-omega-3 fatty acids and multivitamin.  Meds ordered this encounter  Medications  . metoprolol succinate (TOPROL-XL) 100 MG 24 hr tablet    Sig: Take 1 tablet (100 mg total) by mouth daily. Take with or immediately following a meal.    Dispense:  30 tablet    Refill:  3    Problem List Items Addressed This Visit      Unprioritized   Essential hypertension - Primary    Poorly controlled will alter medications, encouraged DASH diet, minimize caffeine and obtain adequate sleep. Report concerning symptoms and follow up as directed and as needed      Relevant Medications   metoprolol succinate (TOPROL-XL) 100 MG 24 hr tablet      Follow-up: Return in about 2 weeks (around 07/01/2019) for hypertension---- nurse visit ----- f/u 3 months with me.  William Held, DO

## 2019-06-17 NOTE — Patient Instructions (Signed)
DASH Eating Plan DASH stands for "Dietary Approaches to Stop Hypertension." The DASH eating plan is a healthy eating plan that has been shown to reduce high blood pressure (hypertension). It may also reduce your risk for type 2 diabetes, heart disease, and stroke. The DASH eating plan may also help with weight loss. What are tips for following this plan?  General guidelines  Avoid eating more than 2,300 mg (milligrams) of salt (sodium) a day. If you have hypertension, you may need to reduce your sodium intake to 1,500 mg a day.  Limit alcohol intake to no more than 1 drink a day for nonpregnant women and 2 drinks a day for men. One drink equals 12 oz of beer, 5 oz of wine, or 1 oz of hard liquor.  Work with your health care provider to maintain a healthy body weight or to lose weight. Ask what an ideal weight is for you.  Get at least 30 minutes of exercise that causes your heart to beat faster (aerobic exercise) most days of the week. Activities may include walking, swimming, or biking.  Work with your health care provider or diet and nutrition specialist (dietitian) to adjust your eating plan to your individual calorie needs. Reading food labels   Check food labels for the amount of sodium per serving. Choose foods with less than 5 percent of the Daily Value of sodium. Generally, foods with less than 300 mg of sodium per serving fit into this eating plan.  To find whole grains, look for the word "whole" as the first word in the ingredient list. Shopping  Buy products labeled as "low-sodium" or "no salt added."  Buy fresh foods. Avoid canned foods and premade or frozen meals. Cooking  Avoid adding salt when cooking. Use salt-free seasonings or herbs instead of table salt or sea salt. Check with your health care provider or pharmacist before using salt substitutes.  Do not fry foods. Cook foods using healthy methods such as baking, boiling, grilling, and broiling instead.  Cook with  heart-healthy oils, such as olive, canola, soybean, or sunflower oil. Meal planning  Eat a balanced diet that includes: ? 5 or more servings of fruits and vegetables each day. At each meal, try to fill half of your plate with fruits and vegetables. ? Up to 6-8 servings of whole grains each day. ? Less than 6 oz of lean meat, poultry, or fish each day. A 3-oz serving of meat is about the same size as a deck of cards. One egg equals 1 oz. ? 2 servings of low-fat dairy each day. ? A serving of nuts, seeds, or beans 5 times each week. ? Heart-healthy fats. Healthy fats called Omega-3 fatty acids are found in foods such as flaxseeds and coldwater fish, like sardines, salmon, and mackerel.  Limit how much you eat of the following: ? Canned or prepackaged foods. ? Food that is high in trans fat, such as fried foods. ? Food that is high in saturated fat, such as fatty meat. ? Sweets, desserts, sugary drinks, and other foods with added sugar. ? Full-fat dairy products.  Do not salt foods before eating.  Try to eat at least 2 vegetarian meals each week.  Eat more home-cooked food and less restaurant, buffet, and fast food.  When eating at a restaurant, ask that your food be prepared with less salt or no salt, if possible. What foods are recommended? The items listed may not be a complete list. Talk with your dietitian about   what dietary choices are best for you. Grains Whole-grain or whole-wheat bread. Whole-grain or whole-wheat pasta. Brown rice. Oatmeal. Quinoa. Bulgur. Whole-grain and low-sodium cereals. Pita bread. Low-fat, low-sodium crackers. Whole-wheat flour tortillas. Vegetables Fresh or frozen vegetables (raw, steamed, roasted, or grilled). Low-sodium or reduced-sodium tomato and vegetable juice. Low-sodium or reduced-sodium tomato sauce and tomato paste. Low-sodium or reduced-sodium canned vegetables. Fruits All fresh, dried, or frozen fruit. Canned fruit in natural juice (without  added sugar). Meat and other protein foods Skinless chicken or turkey. Ground chicken or turkey. Pork with fat trimmed off. Fish and seafood. Egg whites. Dried beans, peas, or lentils. Unsalted nuts, nut butters, and seeds. Unsalted canned beans. Lean cuts of beef with fat trimmed off. Low-sodium, lean deli meat. Dairy Low-fat (1%) or fat-free (skim) milk. Fat-free, low-fat, or reduced-fat cheeses. Nonfat, low-sodium ricotta or cottage cheese. Low-fat or nonfat yogurt. Low-fat, low-sodium cheese. Fats and oils Soft margarine without trans fats. Vegetable oil. Low-fat, reduced-fat, or light mayonnaise and salad dressings (reduced-sodium). Canola, safflower, olive, soybean, and sunflower oils. Avocado. Seasoning and other foods Herbs. Spices. Seasoning mixes without salt. Unsalted popcorn and pretzels. Fat-free sweets. What foods are not recommended? The items listed may not be a complete list. Talk with your dietitian about what dietary choices are best for you. Grains Baked goods made with fat, such as croissants, muffins, or some breads. Dry pasta or rice meal packs. Vegetables Creamed or fried vegetables. Vegetables in a cheese sauce. Regular canned vegetables (not low-sodium or reduced-sodium). Regular canned tomato sauce and paste (not low-sodium or reduced-sodium). Regular tomato and vegetable juice (not low-sodium or reduced-sodium). Pickles. Olives. Fruits Canned fruit in a light or heavy syrup. Fried fruit. Fruit in cream or butter sauce. Meat and other protein foods Fatty cuts of meat. Ribs. Fried meat. Bacon. Sausage. Bologna and other processed lunch meats. Salami. Fatback. Hotdogs. Bratwurst. Salted nuts and seeds. Canned beans with added salt. Canned or smoked fish. Whole eggs or egg yolks. Chicken or turkey with skin. Dairy Whole or 2% milk, cream, and half-and-half. Whole or full-fat cream cheese. Whole-fat or sweetened yogurt. Full-fat cheese. Nondairy creamers. Whipped toppings.  Processed cheese and cheese spreads. Fats and oils Butter. Stick margarine. Lard. Shortening. Ghee. Bacon fat. Tropical oils, such as coconut, palm kernel, or palm oil. Seasoning and other foods Salted popcorn and pretzels. Onion salt, garlic salt, seasoned salt, table salt, and sea salt. Worcestershire sauce. Tartar sauce. Barbecue sauce. Teriyaki sauce. Soy sauce, including reduced-sodium. Steak sauce. Canned and packaged gravies. Fish sauce. Oyster sauce. Cocktail sauce. Horseradish that you find on the shelf. Ketchup. Mustard. Meat flavorings and tenderizers. Bouillon cubes. Hot sauce and Tabasco sauce. Premade or packaged marinades. Premade or packaged taco seasonings. Relishes. Regular salad dressings. Where to find more information:  National Heart, Lung, and Blood Institute: www.nhlbi.nih.gov  American Heart Association: www.heart.org Summary  The DASH eating plan is a healthy eating plan that has been shown to reduce high blood pressure (hypertension). It may also reduce your risk for type 2 diabetes, heart disease, and stroke.  With the DASH eating plan, you should limit salt (sodium) intake to 2,300 mg a day. If you have hypertension, you may need to reduce your sodium intake to 1,500 mg a day.  When on the DASH eating plan, aim to eat more fresh fruits and vegetables, whole grains, lean proteins, low-fat dairy, and heart-healthy fats.  Work with your health care provider or diet and nutrition specialist (dietitian) to adjust your eating plan to your   individual calorie needs. This information is not intended to replace advice given to you by your health care provider. Make sure you discuss any questions you have with your health care provider. Document Revised: 02/10/2017 Document Reviewed: 02/22/2016 Elsevier Patient Education  2020 Elsevier Inc.  

## 2019-06-17 NOTE — Assessment & Plan Note (Signed)
Poorly controlled will alter medications, encouraged DASH diet, minimize caffeine and obtain adequate sleep. Report concerning symptoms and follow up as directed and as needed 

## 2019-06-24 ENCOUNTER — Ambulatory Visit: Payer: 59 | Attending: Internal Medicine

## 2019-06-24 DIAGNOSIS — Z23 Encounter for immunization: Secondary | ICD-10-CM

## 2019-06-24 NOTE — Progress Notes (Signed)
   Covid-19 Vaccination Clinic  Name:  William Lawrence    MRN: [id number] DOB: February 26, 1966  06/24/2019  Mr. Daily was observed post Covid-19 immunization for 15 minutes without incident. He was provided with Vaccine Information Sheet and instruction to access the V-Safe system.   [name] was instructed to call 911 with any severe reactions post vaccine: Marland Kitchen Difficulty breathing  . Swelling of face and throat  . A fast heartbeat  . A bad rash all over body  . Dizziness and weakness   Immunizations Administered    Name Date Dose VIS Date Route   Pfizer COVID-19 Vaccine 06/24/2019  2:21 PM 0.3 mL 02/22/2019 Intramuscular   Manufacturer: [employer], [employer]   Lot: [id number]   [place]: [id number]

## 2019-06-27 ENCOUNTER — Encounter: Payer: Self-pay | Admitting: Gastroenterology

## 2019-07-02 ENCOUNTER — Other Ambulatory Visit: Payer: Self-pay

## 2019-07-02 ENCOUNTER — Ambulatory Visit (INDEPENDENT_AMBULATORY_CARE_PROVIDER_SITE_OTHER): Payer: 59 | Admitting: Family Medicine

## 2019-07-02 VITALS — BP 138/89 | HR 68

## 2019-07-02 DIAGNOSIS — I10 Essential (primary) hypertension: Secondary | ICD-10-CM

## 2019-07-02 MED ORDER — LOSARTAN POTASSIUM 50 MG PO TABS: 50.0000 mg | ORAL_TABLET | Freq: Every day | ORAL | 0 refills | Status: DC

## 2019-07-02 NOTE — Progress Notes (Addendum)
Pt here for Blood pressure check per Dr. Etter Sjogren   Pt currently takes: metoprolol 100 mg    Pt reports compliance with medication.  BP today @ = 138 /89 HR = 68   Pt advised per [place], DO continue current dose but add losartan 50 mg daily.  \\   Noted  Ann Held, DO

## 2019-08-10 ENCOUNTER — Other Ambulatory Visit: Payer: Self-pay | Admitting: Family Medicine

## 2019-08-10 DIAGNOSIS — I10 Essential (primary) hypertension: Secondary | ICD-10-CM

## 2019-08-15 ENCOUNTER — Other Ambulatory Visit: Payer: Self-pay

## 2019-08-15 ENCOUNTER — Ambulatory Visit (AMBULATORY_SURGERY_CENTER): Payer: Self-pay | Admitting: *Deleted

## 2019-08-15 VITALS — Ht 70.0 in | Wt 187.0 lb

## 2019-08-15 DIAGNOSIS — Z1211 Encounter for screening for malignant neoplasm of colon: Secondary | ICD-10-CM

## 2019-08-15 MED ORDER — SUTAB 1479-225-188 MG PO TABS: 24.0000 | ORAL_TABLET | ORAL | 0 refills | Status: DC

## 2019-08-15 NOTE — Progress Notes (Signed)
06-24-19 comp covid vaccines  No egg or soy allergy known to patient  No issues with past sedation with any surgeries  or procedures, no past intubation   No diet pills per patient No home 02 use per patient  No blood thinners per patient  Pt denies issues with constipation  No A fib or A flutter  EMMI video sent to pt's e mail   sutab code to pharmacy and coupon to pt in [place]   Due to the COVID-19 pandemic we are asking patients to follow these guidelines. Please only bring one care partner. Please be aware that your care partner may wait in the car in the parking lot or if they feel like they will be too hot to wait in the car, they may wait in the lobby on the 4th floor. All care partners are required to wear a mask the entire time (we do not have any that we can provide them), they need to practice social distancing, and we will do a Covid check for all patient's and care partners when you arrive. Also we will check their temperature and your temperature. If the care partner waits in their car they need to stay in the parking lot the entire time and we will call them on their cell phone when the patient is ready for discharge so they can bring the car to the front of the building. Also all patient's will need to wear a mask into building.

## 2019-08-23 ENCOUNTER — Encounter: Payer: Self-pay | Admitting: Gastroenterology

## 2019-08-29 ENCOUNTER — Encounter: Payer: Self-pay | Admitting: Gastroenterology

## 2019-08-29 ENCOUNTER — Other Ambulatory Visit: Payer: Self-pay

## 2019-08-29 ENCOUNTER — Ambulatory Visit (AMBULATORY_SURGERY_CENTER): Payer: No Typology Code available for payment source | Admitting: Gastroenterology

## 2019-08-29 VITALS — BP 109/76 | HR 64 | Temp 97.8°F | Resp 13 | Ht 70.0 in | Wt 187.0 lb

## 2019-08-29 DIAGNOSIS — D122 Benign neoplasm of ascending colon: Secondary | ICD-10-CM

## 2019-08-29 DIAGNOSIS — D123 Benign neoplasm of transverse colon: Secondary | ICD-10-CM | POA: Diagnosis not present

## 2019-08-29 DIAGNOSIS — Z1211 Encounter for screening for malignant neoplasm of colon: Secondary | ICD-10-CM

## 2019-08-29 DIAGNOSIS — I10 Essential (primary) hypertension: Secondary | ICD-10-CM | POA: Diagnosis not present

## 2019-08-29 MED ORDER — SODIUM CHLORIDE 0.9 % IV SOLN: 500.0000 mL | INTRAVENOUS | Status: DC

## 2019-08-29 NOTE — Progress Notes (Signed)
Vs CW ° °

## 2019-08-29 NOTE — Op Note (Signed)
[place] Patient Name: William Lawrence Procedure Date: 08/29/2019 8:45 AM MRN: [id number] Endoscopist: Remo Lipps P. Havery Moros , MD Age: 54 Referring MD:  Date of Birth: 1966-02-20 Gender: Male Account #: [id number] Procedure:                Colonoscopy Indications:              Screening for colorectal malignant neoplasm, This                            is the patient's first colonoscopy Medicines:                Monitored Anesthesia Care Procedure:                Pre-Anesthesia Assessment:                           - Prior to the procedure, a History and Physical                            was performed, and patient medications and                            allergies were reviewed. The patient's tolerance of                            previous anesthesia was also reviewed. The risks                            and benefits of the procedure and the sedation                            options and risks were discussed with the patient.                            All questions were answered, and informed consent                            was obtained. Prior Anticoagulants: The patient has                            taken no previous anticoagulant or antiplatelet                            agents. ASA Grade Assessment: II - A patient with                            mild systemic disease. After reviewing the risks                            and benefits, the patient was deemed in                            satisfactory condition to undergo the procedure.  After obtaining informed consent, the colonoscope                            was passed under direct vision. Throughout the                            procedure, the patient's blood pressure, pulse, and                            oxygen saturations were monitored continuously. The                            Colonoscope was introduced through the anus and                            advanced to the the  cecum, identified by                            appendiceal orifice and ileocecal valve. The                            colonoscopy was performed without difficulty. The                            patient tolerated the procedure well. The quality                            of the bowel preparation was good. The ileocecal                            valve, appendiceal orifice, and rectum were                            photographed. Scope In: 8:50:43 AM Scope Out: 9:05:24 AM Scope Withdrawal Time: 0 hours 11 minutes 24 seconds  Total Procedure Duration: 0 hours 14 minutes 41 seconds  Findings:                 The perianal and digital rectal examinations were                            normal.                           A 3 mm polyp was found in the ascending colon. The                            polyp was sessile. The polyp was removed with a                            cold snare. Resection and retrieval were complete.                           Three sessile polyps were found in the transverse  colon. The polyps were 4 to 7 mm in size. These                            polyps were removed with a cold snare. Resection                            and retrieval were complete.                           A few small-mouthed diverticula were found in the                            sigmoid colon.                           Internal hemorrhoids were found during                            retroflexion. The hemorrhoids were small.                           The exam was otherwise without abnormality. Complications:            No immediate complications. Estimated blood loss:                            Minimal. Estimated Blood Loss:     Estimated blood loss was minimal. Impression:               - One 3 mm polyp in the ascending colon, removed                            with a cold snare. Resected and retrieved.                           - Three 4 to 7 mm polyps in the  transverse colon,                            removed with a cold snare. Resected and retrieved.                           - Diverticulosis in the sigmoid colon.                           - Internal hemorrhoids.                           - The examination was otherwise normal. Recommendation:           - Patient has a contact number available for                            emergencies. The signs and symptoms of potential                            delayed complications  were discussed with the                            patient. Return to normal activities tomorrow.                            Written discharge instructions were provided to the                            patient.                           - Resume previous diet.                           - Continue present medications.                           - Await pathology results. Remo Lipps P. Avedis Bevis, MD 08/29/2019 9:08:51 AM This report has been signed electronically.

## 2019-08-29 NOTE — Progress Notes (Signed)
Called to room to assist during endoscopic procedure.  Patient ID and intended procedure confirmed with present staff. Received instructions for my participation in the procedure from the performing physician.  

## 2019-08-29 NOTE — Progress Notes (Signed)
Report to PACU, RN, vss, BBS= Clear.  

## 2019-08-29 NOTE — Patient Instructions (Signed)
Read all of the handouts given to you by your recovery room nurse.  Thank-you for choosing us for your healthcare needs today.  YOU HAD AN ENDOSCOPIC PROCEDURE TODAY AT THE  ENDOSCOPY CENTER:   Refer to the procedure report that was given to you for any specific questions about what was found during the examination.  If the procedure report does not answer your questions, please call your gastroenterologist to clarify.  If you requested that your care partner not be given the details of your procedure findings, then the procedure report has been included in a sealed envelope for you to review at your convenience later.  YOU SHOULD EXPECT: Some feelings of bloating in the abdomen. Passage of more gas than usual.  Walking can help get rid of the air that was put into your GI tract during the procedure and reduce the bloating. If you had a lower endoscopy (such as a colonoscopy or flexible sigmoidoscopy) you may notice spotting of blood in your stool or on the toilet paper. If you underwent a bowel prep for your procedure, you may not have a normal bowel movement for a few days.  Please Note:  You might notice some irritation and congestion in your nose or some drainage.  This is from the oxygen used during your procedure.  There is no need for concern and it should clear up in a day or so.  SYMPTOMS TO REPORT IMMEDIATELY:   Following lower endoscopy (colonoscopy or flexible sigmoidoscopy):  Excessive amounts of blood in the stool  Significant tenderness or worsening of abdominal pains  Swelling of the abdomen that is new, acute  Fever of 100F or higher   For urgent or emergent issues, a gastroenterologist can be reached at any hour by calling [phone]. Do not use MyChart messaging for urgent concerns.    DIET:  We do recommend a small meal at first, but then you may proceed to your regular diet.  Drink plenty of fluids but you should avoid alcoholic beverages for 24 hours. Try to  increase the fiber in your diet, and drink plenty of water.  ACTIVITY:  You should plan to take it easy for the rest of today and you should NOT DRIVE or use heavy machinery until tomorrow (because of the sedation medicines used during the test).    FOLLOW UP: Our staff will call the number listed on your records 48-72 hours following your procedure to check on you and address any questions or concerns that you may have regarding the information given to you following your procedure. If we do not reach you, we will leave a message.  We will attempt to reach you two times.  During this call, we will ask if you have developed any symptoms of COVID 19. If you develop any symptoms (ie: fever, flu-like symptoms, shortness of breath, cough etc.) before then, please call [phone].  If you test positive for Covid 19 in the 2 weeks post procedure, please call and report this information to us.    If any biopsies were taken you will be contacted by phone or by letter within the next 1-3 weeks.  Please call us at [phone] if you have not heard about the biopsies in 3 weeks.    SIGNATURES/CONFIDENTIALITY: You and/or your care partner have signed paperwork which will be entered into your electronic medical record.  These signatures attest to the fact that that the information above on your After Visit Summary has been reviewed   and is understood.  Full responsibility of the confidentiality of this discharge information lies with you and/or your care-partner. 

## 2019-09-02 ENCOUNTER — Telehealth: Payer: Self-pay

## 2019-09-02 NOTE — Telephone Encounter (Signed)
°  Follow up Call-  Call back number 08/29/2019  Post procedure Call Back phone  # [phone]  Permission to leave phone message Yes  Some recent data might be hidden     Patient questions:  Do you have a fever, pain , or abdominal swelling? No. Pain Score  0 *  Have you tolerated food without any problems? Yes.    Have you been able to return to your normal activities? Yes.    Do you have any questions about your discharge instructions: Diet   No. Medications  No. Follow up visit  No.  Do you have questions or concerns about your Care? No.  Actions: * If pain score is 4 or above: 1. No action needed, pain <4.Have you developed a fever since your procedure? no  2.   Have you had an respiratory symptoms (SOB or cough) since your procedure? no  3.   Have you tested positive for COVID 19 since your procedure no  4.   Have you had any family members/close contacts diagnosed with the COVID 19 since your procedure?  no   If yes to any of these questions please route to Joylene John, RN and Erenest Rasher, RN

## 2019-09-09 ENCOUNTER — Encounter: Payer: Self-pay | Admitting: Gastroenterology

## 2019-09-16 DIAGNOSIS — Z03818 Encounter for observation for suspected exposure to other biological agents ruled out: Secondary | ICD-10-CM | POA: Diagnosis not present

## 2019-09-16 DIAGNOSIS — Z20822 Contact with and (suspected) exposure to covid-19: Secondary | ICD-10-CM | POA: Diagnosis not present

## 2019-09-23 ENCOUNTER — Ambulatory Visit: Payer: 59 | Admitting: Family Medicine

## 2019-09-24 ENCOUNTER — Other Ambulatory Visit: Payer: Self-pay | Admitting: Family Medicine

## 2019-09-24 DIAGNOSIS — I10 Essential (primary) hypertension: Secondary | ICD-10-CM

## 2019-09-30 ENCOUNTER — Other Ambulatory Visit: Payer: Self-pay

## 2019-09-30 ENCOUNTER — Encounter: Payer: Self-pay | Admitting: Family Medicine

## 2019-09-30 ENCOUNTER — Ambulatory Visit (INDEPENDENT_AMBULATORY_CARE_PROVIDER_SITE_OTHER): Payer: No Typology Code available for payment source | Admitting: Family Medicine

## 2019-09-30 VITALS — BP 130/100 | HR 72 | Temp 98.4°F | Resp 18 | Ht 70.0 in | Wt 190.8 lb

## 2019-09-30 DIAGNOSIS — E1169 Type 2 diabetes mellitus with other specified complication: Secondary | ICD-10-CM | POA: Diagnosis not present

## 2019-09-30 DIAGNOSIS — E785 Hyperlipidemia, unspecified: Secondary | ICD-10-CM

## 2019-09-30 DIAGNOSIS — I10 Essential (primary) hypertension: Secondary | ICD-10-CM

## 2019-09-30 MED ORDER — LOSARTAN POTASSIUM 100 MG PO TABS: 100.0000 mg | ORAL_TABLET | Freq: Every day | ORAL | 3 refills | Status: DC

## 2019-09-30 MED ORDER — AMLODIPINE BESYLATE 5 MG PO TABS: 5.0000 mg | ORAL_TABLET | Freq: Every day | ORAL | 2 refills | Status: DC

## 2019-09-30 NOTE — Patient Instructions (Signed)
DASH Eating Plan DASH stands for "Dietary Approaches to Stop Hypertension." The DASH eating plan is a healthy eating plan that has been shown to reduce high blood pressure (hypertension). It may also reduce your risk for type 2 diabetes, heart disease, and stroke. The DASH eating plan may also help with weight loss. What are tips for following this plan?  General guidelines  Avoid eating more than 2,300 mg (milligrams) of salt (sodium) a day. If you have hypertension, you may need to reduce your sodium intake to 1,500 mg a day.  Limit alcohol intake to no more than 1 drink a day for nonpregnant women and 2 drinks a day for men. One drink equals 12 oz of beer, 5 oz of wine, or 1 oz of hard liquor.  Work with your health care provider to maintain a healthy body weight or to lose weight. Ask what an ideal weight is for you.  Get at least 30 minutes of exercise that causes your heart to beat faster (aerobic exercise) most days of the week. Activities may include walking, swimming, or biking.  Work with your health care provider or diet and nutrition specialist (dietitian) to adjust your eating plan to your individual calorie needs. Reading food labels   Check food labels for the amount of sodium per serving. Choose foods with less than 5 percent of the Daily Value of sodium. Generally, foods with less than 300 mg of sodium per serving fit into this eating plan.  To find whole grains, look for the word "whole" as the first word in the ingredient list. Shopping  Buy products labeled as "low-sodium" or "no salt added."  Buy fresh foods. Avoid canned foods and premade or frozen meals. Cooking  Avoid adding salt when cooking. Use salt-free seasonings or herbs instead of table salt or sea salt. Check with your health care provider or pharmacist before using salt substitutes.  Do not fry foods. Cook foods using healthy methods such as baking, boiling, grilling, and broiling instead.  Cook with  heart-healthy oils, such as olive, canola, soybean, or sunflower oil. Meal planning  Eat a balanced diet that includes: ? 5 or more servings of fruits and vegetables each day. At each meal, try to fill half of your plate with fruits and vegetables. ? Up to 6-8 servings of whole grains each day. ? Less than 6 oz of lean meat, poultry, or fish each day. A 3-oz serving of meat is about the same size as a deck of cards. One egg equals 1 oz. ? 2 servings of low-fat dairy each day. ? A serving of nuts, seeds, or beans 5 times each week. ? Heart-healthy fats. Healthy fats called Omega-3 fatty acids are found in foods such as flaxseeds and coldwater fish, like sardines, salmon, and mackerel.  Limit how much you eat of the following: ? Canned or prepackaged foods. ? Food that is high in trans fat, such as fried foods. ? Food that is high in saturated fat, such as fatty meat. ? Sweets, desserts, sugary drinks, and other foods with added sugar. ? Full-fat dairy products.  Do not salt foods before eating.  Try to eat at least 2 vegetarian meals each week.  Eat more home-cooked food and less restaurant, buffet, and fast food.  When eating at a restaurant, ask that your food be prepared with less salt or no salt, if possible. What foods are recommended? The items listed may not be a complete list. Talk with your dietitian about   what dietary choices are best for you. Grains Whole-grain or whole-wheat bread. Whole-grain or whole-wheat pasta. Brown rice. Oatmeal. Quinoa. Bulgur. Whole-grain and low-sodium cereals. Pita bread. Low-fat, low-sodium crackers. Whole-wheat flour tortillas. Vegetables Fresh or frozen vegetables (raw, steamed, roasted, or grilled). Low-sodium or reduced-sodium tomato and vegetable juice. Low-sodium or reduced-sodium tomato sauce and tomato paste. Low-sodium or reduced-sodium canned vegetables. Fruits All fresh, dried, or frozen fruit. Canned fruit in natural juice (without  added sugar). Meat and other protein foods Skinless chicken or turkey. Ground chicken or turkey. Pork with fat trimmed off. Fish and seafood. Egg whites. Dried beans, peas, or lentils. Unsalted nuts, nut butters, and seeds. Unsalted canned beans. Lean cuts of beef with fat trimmed off. Low-sodium, lean deli meat. Dairy Low-fat (1%) or fat-free (skim) milk. Fat-free, low-fat, or reduced-fat cheeses. Nonfat, low-sodium ricotta or cottage cheese. Low-fat or nonfat yogurt. Low-fat, low-sodium cheese. Fats and oils Soft margarine without trans fats. Vegetable oil. Low-fat, reduced-fat, or light mayonnaise and salad dressings (reduced-sodium). Canola, safflower, olive, soybean, and sunflower oils. Avocado. Seasoning and other foods Herbs. Spices. Seasoning mixes without salt. Unsalted popcorn and pretzels. Fat-free sweets. What foods are not recommended? The items listed may not be a complete list. Talk with your dietitian about what dietary choices are best for you. Grains Baked goods made with fat, such as croissants, muffins, or some breads. Dry pasta or rice meal packs. Vegetables Creamed or fried vegetables. Vegetables in a cheese sauce. Regular canned vegetables (not low-sodium or reduced-sodium). Regular canned tomato sauce and paste (not low-sodium or reduced-sodium). Regular tomato and vegetable juice (not low-sodium or reduced-sodium). Pickles. Olives. Fruits Canned fruit in a light or heavy syrup. Fried fruit. Fruit in cream or butter sauce. Meat and other protein foods Fatty cuts of meat. Ribs. Fried meat. Bacon. Sausage. Bologna and other processed lunch meats. Salami. Fatback. Hotdogs. Bratwurst. Salted nuts and seeds. Canned beans with added salt. Canned or smoked fish. Whole eggs or egg yolks. Chicken or turkey with skin. Dairy Whole or 2% milk, cream, and half-and-half. Whole or full-fat cream cheese. Whole-fat or sweetened yogurt. Full-fat cheese. Nondairy creamers. Whipped toppings.  Processed cheese and cheese spreads. Fats and oils Butter. Stick margarine. Lard. Shortening. Ghee. Bacon fat. Tropical oils, such as coconut, palm kernel, or palm oil. Seasoning and other foods Salted popcorn and pretzels. Onion salt, garlic salt, seasoned salt, table salt, and sea salt. Worcestershire sauce. Tartar sauce. Barbecue sauce. Teriyaki sauce. Soy sauce, including reduced-sodium. Steak sauce. Canned and packaged gravies. Fish sauce. Oyster sauce. Cocktail sauce. Horseradish that you find on the shelf. Ketchup. Mustard. Meat flavorings and tenderizers. Bouillon cubes. Hot sauce and Tabasco sauce. Premade or packaged marinades. Premade or packaged taco seasonings. Relishes. Regular salad dressings. Where to find more information:  National Heart, Lung, and Blood Institute: www.nhlbi.nih.gov  American Heart Association: www.heart.org Summary  The DASH eating plan is a healthy eating plan that has been shown to reduce high blood pressure (hypertension). It may also reduce your risk for type 2 diabetes, heart disease, and stroke.  With the DASH eating plan, you should limit salt (sodium) intake to 2,300 mg a day. If you have hypertension, you may need to reduce your sodium intake to 1,500 mg a day.  When on the DASH eating plan, aim to eat more fresh fruits and vegetables, whole grains, lean proteins, low-fat dairy, and heart-healthy fats.  Work with your health care provider or diet and nutrition specialist (dietitian) to adjust your eating plan to your   individual calorie needs. This information is not intended to replace advice given to you by your health care provider. Make sure you discuss any questions you have with your health care provider. Document Revised: 02/10/2017 Document Reviewed: 02/22/2016 Elsevier Patient Education  2020 Elsevier Inc.  

## 2019-09-30 NOTE — Progress Notes (Signed)
Patient ID: William Lawrence, male    DOB: [birth year]  Age: 54 y.o. MRN: [id number]    Subjective:  Subjective  HPI William Lawrence presents for bp f/u   No complaints  Review of Systems  Constitutional: Negative for appetite change, diaphoresis, fatigue and unexpected weight change.  Eyes: Negative for pain, redness and visual disturbance.  Respiratory: Negative for cough, chest tightness, shortness of breath and wheezing.   Cardiovascular: Negative for chest pain, palpitations and leg swelling.  Endocrine: Negative for cold intolerance, heat intolerance, polydipsia, polyphagia and polyuria.  Genitourinary: Negative for difficulty urinating, dysuria and frequency.  Neurological: Negative for dizziness, light-headedness, numbness and headaches.    History Past Medical History:  Diagnosis Date  . Allergy    no meds   . Hypertension 2021    He has a past surgical history that includes Wisdom tooth extraction.   His family history includes Diabetes in his paternal grandmother; Hypertension in his brother, father, mother, and paternal grandmother; Prostate cancer in his maternal uncle.He reports that he has never smoked. He has never used smokeless tobacco. He reports current alcohol use. He reports that he does not use drugs.  Current Outpatient Medications on File Prior to Visit  Medication Sig Dispense Refill  . metoprolol succinate (TOPROL-XL) 100 MG 24 hr tablet TAKE 1 TABLET (100 MG TOTAL) BY MOUTH DAILY. TAKE WITH OR IMMEDIATELY FOLLOWING A MEAL. 90 tablet 2  . Multiple Vitamin (MULTIVITAMIN) tablet Take 1 tablet by mouth daily.     No current facility-administered medications on file prior to visit.     Objective:  Objective  Physical Exam Vitals and nursing note reviewed.  Constitutional:      General: He is sleeping.     Appearance: He is well-developed.  HENT:     Head: Normocephalic and atraumatic.  Eyes:     Pupils: Pupils are equal, round, and reactive to light.    Neck:     Thyroid: No thyromegaly.  Cardiovascular:     Rate and Rhythm: Normal rate and regular rhythm.     Heart sounds: No murmur heard.   Pulmonary:     Effort: Pulmonary effort is normal. No respiratory distress.     Breath sounds: Normal breath sounds. No wheezing or rales.  Chest:     Chest wall: No tenderness.  Musculoskeletal:        General: No tenderness.     Cervical back: Normal range of motion and neck supple.  Skin:    General: Skin is warm and dry.  Neurological:     Mental Status: He is oriented to person, place, and time.  Psychiatric:        Behavior: Behavior normal.        Thought Content: Thought content normal.        Judgment: Judgment normal.    BP (!) 130/100 (BP Location: Left Arm, Patient Position: Sitting, Cuff Size: Large)   Pulse 72   Temp 98.4 F (36.9 C) (Oral)   Resp 18   Ht 5\' 10"  (1.778 m)   Wt 190 lb 12.8 oz (86.5 kg)   SpO2 99%   BMI 27.38 kg/m  Wt Readings from Last 3 Encounters:  09/30/19 190 lb 12.8 oz (86.5 kg)  08/29/19 187 lb (84.8 kg)  08/15/19 187 lb (84.8 kg)     Lab Results  Component Value Date   WBC 4.1 05/27/2019   HGB 14.2 05/27/2019   HCT 42.1 05/27/2019   PLT 176.0 05/27/2019  GLUCOSE 86 05/27/2019   CHOL 190 05/27/2019   TRIG 126.0 05/27/2019   HDL 45.30 05/27/2019   LDLCALC 120 (H) 05/27/2019   ALT 43 05/27/2019   AST 25 05/27/2019   NA 138 05/27/2019   K 3.7 05/27/2019   CL 103 05/27/2019   CREATININE 1.17 05/27/2019   BUN 18 05/27/2019   CO2 28 05/27/2019   TSH 1.62 05/27/2019   PSA 1.91 05/27/2019    DG Foot Complete Right  Result Date: 08/10/2018 CLINICAL DATA:  Anterior foot pain after dropping a chair on it yesterday. EXAM: RIGHT FOOT COMPLETE - 3+ VIEW COMPARISON:  None. FINDINGS: There is no evidence of fracture or dislocation. There is no evidence of arthropathy or other focal bone abnormality. Soft tissues are unremarkable. IMPRESSION: Negative. Electronically Signed   By: Titus Dubin M.D.   On: 08/10/2018 10:24     Assessment & Plan:  Plan  I have discontinued Curly Mahoney's fish oil-omega-3 fatty acids, Sutab, losartan, and losartan. I am also having him start on amLODipine. Additionally, I am having him maintain his multivitamin and metoprolol succinate.  Meds ordered this encounter  Medications  . DISCONTD: losartan (COZAAR) 100 MG tablet    Sig: Take 1 tablet (100 mg total) by mouth daily.    Dispense:  90 tablet    Refill:  3  . amLODipine (NORVASC) 5 MG tablet    Sig: Take 1 tablet (5 mg total) by mouth daily.    Dispense:  30 tablet    Refill:  2    Problem List Items Addressed This Visit      Unprioritized   Essential hypertension - Primary   Relevant Medications   amLODipine (NORVASC) 5 MG tablet   Other Relevant Orders   Lipid panel   Comprehensive metabolic panel   Amb ref to Medical Nutrition Therapy-MNT    Other Visit Diagnoses    Hyperlipidemia associated with type 2 diabetes mellitus ([place])       Relevant Orders   Lipid panel   Comprehensive metabolic panel   Amb ref to Medical Nutrition Therapy-MNT      Follow-up: Return in about 3 weeks (around 10/21/2019) for hypertension.  William Held, DO

## 2019-10-01 DIAGNOSIS — E785 Hyperlipidemia, unspecified: Secondary | ICD-10-CM | POA: Insufficient documentation

## 2019-10-01 NOTE — Assessment & Plan Note (Signed)
Encouraged heart healthy diet, increase exercise, avoid trans fats, consider a krill oil cap daily 

## 2019-10-01 NOTE — Assessment & Plan Note (Signed)
Well controlled, no changes to meds. Encouraged heart healthy diet such as the DASH diet and exercise as tolerated.  °

## 2019-10-21 ENCOUNTER — Encounter: Payer: Self-pay | Admitting: Family Medicine

## 2019-10-21 ENCOUNTER — Ambulatory Visit (INDEPENDENT_AMBULATORY_CARE_PROVIDER_SITE_OTHER): Payer: No Typology Code available for payment source | Admitting: Family Medicine

## 2019-10-21 ENCOUNTER — Other Ambulatory Visit: Payer: Self-pay

## 2019-10-21 VITALS — BP 141/91 | HR 67 | Temp 98.7°F | Resp 18 | Ht 70.0 in | Wt 190.0 lb

## 2019-10-21 DIAGNOSIS — E785 Hyperlipidemia, unspecified: Secondary | ICD-10-CM | POA: Diagnosis not present

## 2019-10-21 DIAGNOSIS — I10 Essential (primary) hypertension: Secondary | ICD-10-CM | POA: Diagnosis not present

## 2019-10-21 LAB — LIPID PANEL
Cholesterol: 190 mg/dL (ref 0–200)
HDL: 49 mg/dL (ref >39.00)
LDL Cholesterol: 121 mg/dL — ABNORMAL HIGH (ref 0–99)
NonHDL: 141.14
Total CHOL/HDL Ratio: 4
Triglycerides: 101 mg/dL (ref 0.0–149.0)
VLDL: 20.2 mg/dL (ref 0.0–40.0)

## 2019-10-21 LAB — COMPREHENSIVE METABOLIC PANEL
ALT: 31 U/L (ref 0–53)
AST: 21 U/L (ref 0–37)
Albumin: 4.1 g/dL (ref 3.5–5.2)
Alkaline Phosphatase: 74 U/L (ref 39–117)
BUN: 13 mg/dL (ref 6–23)
CO2: 29 mEq/L (ref 19–32)
Calcium: 9.1 mg/dL (ref 8.4–10.5)
Chloride: 104 mEq/L (ref 96–112)
Creatinine, Ser: 1.22 mg/dL (ref 0.40–1.50)
GFR: 74.62 mL/min (ref >60.00)
Glucose, Bld: 103 mg/dL — ABNORMAL HIGH (ref 70–99)
Potassium: 3.8 mEq/L (ref 3.5–5.1)
Sodium: 140 mEq/L (ref 135–145)
Total Bilirubin: 0.6 mg/dL (ref 0.2–1.2)
Total Protein: 7.1 g/dL (ref 6.0–8.3)

## 2019-10-21 NOTE — Progress Notes (Signed)
Patient ID: William Lawrence, male    DOB: 08/21/1965  Age: 54 y.o. MRN: [id number]    Subjective:  Subjective  HPI William Lawrence presents for f/u bp.  bp at home running 125/86, 138/82 No complaints  Review of Systems  Constitutional: Negative for appetite change, diaphoresis, fatigue and unexpected weight change.  Eyes: Negative for pain, redness and visual disturbance.  Respiratory: Negative for cough, chest tightness, shortness of breath and wheezing.   Cardiovascular: Negative for chest pain, palpitations and leg swelling.  Endocrine: Negative for cold intolerance, heat intolerance, polydipsia, polyphagia and polyuria.  Genitourinary: Negative for difficulty urinating, dysuria and frequency.  Neurological: Negative for dizziness, light-headedness, numbness and headaches.    History Past Medical History:  Diagnosis Date  . Allergy    no meds   . Hypertension 2021    He has a past surgical history that includes Wisdom tooth extraction.   His family history includes Diabetes in his paternal grandmother; Hypertension in his brother, father, mother, and paternal grandmother; Prostate cancer in his maternal uncle.He reports that he has never smoked. He has never used smokeless tobacco. He reports current alcohol use. He reports that he does not use drugs.  Current Outpatient Medications on File Prior to Visit  Medication Sig Dispense Refill  . amLODipine (NORVASC) 5 MG tablet Take 1 tablet (5 mg total) by mouth daily. 30 tablet 2  . metoprolol succinate (TOPROL-XL) 100 MG 24 hr tablet TAKE 1 TABLET (100 MG TOTAL) BY MOUTH DAILY. TAKE WITH OR IMMEDIATELY FOLLOWING A MEAL. 90 tablet 2  . Multiple Vitamin (MULTIVITAMIN) tablet Take 1 tablet by mouth daily.     No current facility-administered medications on file prior to visit.     Objective:  Objective  Physical Exam Vitals and nursing note reviewed.  Constitutional:      General: He is sleeping.     Appearance: He is  well-developed.  HENT:     Head: Normocephalic and atraumatic.  Eyes:     Pupils: Pupils are equal, round, and reactive to light.  Neck:     Thyroid: No thyromegaly.  Cardiovascular:     Rate and Rhythm: Normal rate and regular rhythm.     Heart sounds: No murmur heard.   Pulmonary:     Effort: Pulmonary effort is normal. No respiratory distress.     Breath sounds: Normal breath sounds. No wheezing or rales.  Chest:     Chest wall: No tenderness.  Musculoskeletal:        General: No tenderness.     Cervical back: Normal range of motion and neck supple.  Skin:    General: Skin is warm and dry.  Neurological:     Mental Status: He is oriented to person, place, and time.  Psychiatric:        Behavior: Behavior normal.        Thought Content: Thought content normal.        Judgment: Judgment normal.    BP (!) 141/91 (BP Location: Right Arm, Patient Position: Sitting, Cuff Size: Normal)   Pulse 67   Temp 98.7 F (37.1 C) (Oral)   Resp 18   Ht 5\' 10"  (1.778 m)   Wt 190 lb (86.2 kg)   SpO2 100%   BMI 27.26 kg/m  Wt Readings from Last 3 Encounters:  10/21/19 190 lb (86.2 kg)  09/30/19 190 lb 12.8 oz (86.5 kg)  08/29/19 187 lb (84.8 kg)   Repeat bp 128/90  Lab Results  Component  Value Date   WBC 4.1 05/27/2019   HGB 14.2 05/27/2019   HCT 42.1 05/27/2019   PLT 176.0 05/27/2019   GLUCOSE 86 05/27/2019   CHOL 190 05/27/2019   TRIG 126.0 05/27/2019   HDL 45.30 05/27/2019   LDLCALC 120 (H) 05/27/2019   ALT 43 05/27/2019   AST 25 05/27/2019   NA 138 05/27/2019   K 3.7 05/27/2019   CL 103 05/27/2019   CREATININE 1.17 05/27/2019   BUN 18 05/27/2019   CO2 28 05/27/2019   TSH 1.62 05/27/2019   PSA 1.91 05/27/2019    DG Foot Complete Right  Result Date: 08/10/2018 CLINICAL DATA:  Anterior foot pain after dropping a chair on it yesterday. EXAM: RIGHT FOOT COMPLETE - 3+ VIEW COMPARISON:  None. FINDINGS: There is no evidence of fracture or dislocation. There is no  evidence of arthropathy or other focal bone abnormality. Soft tissues are unremarkable. IMPRESSION: Negative. Electronically Signed   By: Titus Dubin M.D.   On: 08/10/2018 10:24     Assessment & Plan:  Plan  I am having [name] maintain his multivitamin, metoprolol succinate, and amLODipine.  No orders of the defined types were placed in this encounter.   Problem List Items Addressed This Visit      Unprioritized   Essential hypertension    Well controlled, no changes to meds. Encouraged heart healthy diet such as the DASH diet and exercise as tolerated.  Pt will get a new bp cuff       Relevant Orders   Lipid panel   Comprehensive metabolic panel    Other Visit Diagnoses    Dyslipidemia    -  Primary   Relevant Orders   Lipid panel   Comprehensive metabolic panel      Follow-up: Return in about 3 months (around 01/21/2020), or if symptoms worsen or fail to improve, for hypertension, hyperlipidemia.  Ann Held, DO

## 2019-10-21 NOTE — Assessment & Plan Note (Signed)
Well controlled, no changes to meds. Encouraged heart healthy diet such as the DASH diet and exercise as tolerated.  Pt will get a new bp cuff

## 2019-10-21 NOTE — Patient Instructions (Signed)
DASH Eating Plan DASH stands for "Dietary Approaches to Stop Hypertension." The DASH eating plan is a healthy eating plan that has been shown to reduce high blood pressure (hypertension). It may also reduce your risk for type 2 diabetes, heart disease, and stroke. The DASH eating plan may also help with weight loss. What are tips for following this plan?  General guidelines  Avoid eating more than 2,300 mg (milligrams) of salt (sodium) a day. If you have hypertension, you may need to reduce your sodium intake to 1,500 mg a day.  Limit alcohol intake to no more than 1 drink a day for nonpregnant women and 2 drinks a day for men. One drink equals 12 oz of beer, 5 oz of wine, or 1 oz of hard liquor.  Work with your health care provider to maintain a healthy body weight or to lose weight. Ask what an ideal weight is for you.  Get at least 30 minutes of exercise that causes your heart to beat faster (aerobic exercise) most days of the week. Activities may include walking, swimming, or biking.  Work with your health care provider or diet and nutrition specialist (dietitian) to adjust your eating plan to your individual calorie needs. Reading food labels   Check food labels for the amount of sodium per serving. Choose foods with less than 5 percent of the Daily Value of sodium. Generally, foods with less than 300 mg of sodium per serving fit into this eating plan.  To find whole grains, look for the word "whole" as the first word in the ingredient list. Shopping  Buy products labeled as "low-sodium" or "no salt added."  Buy fresh foods. Avoid canned foods and premade or frozen meals. Cooking  Avoid adding salt when cooking. Use salt-free seasonings or herbs instead of table salt or sea salt. Check with your health care provider or pharmacist before using salt substitutes.  Do not fry foods. Cook foods using healthy methods such as baking, boiling, grilling, and broiling instead.  Cook with  heart-healthy oils, such as olive, canola, soybean, or sunflower oil. Meal planning  Eat a balanced diet that includes: ? 5 or more servings of fruits and vegetables each day. At each meal, try to fill half of your plate with fruits and vegetables. ? Up to 6-8 servings of whole grains each day. ? Less than 6 oz of lean meat, poultry, or fish each day. A 3-oz serving of meat is about the same size as a deck of cards. One egg equals 1 oz. ? 2 servings of low-fat dairy each day. ? A serving of nuts, seeds, or beans 5 times each week. ? Heart-healthy fats. Healthy fats called Omega-3 fatty acids are found in foods such as flaxseeds and coldwater fish, like sardines, salmon, and mackerel.  Limit how much you eat of the following: ? Canned or prepackaged foods. ? Food that is high in trans fat, such as fried foods. ? Food that is high in saturated fat, such as fatty meat. ? Sweets, desserts, sugary drinks, and other foods with added sugar. ? Full-fat dairy products.  Do not salt foods before eating.  Try to eat at least 2 vegetarian meals each week.  Eat more home-cooked food and less restaurant, buffet, and fast food.  When eating at a restaurant, ask that your food be prepared with less salt or no salt, if possible. What foods are recommended? The items listed may not be a complete list. Talk with your dietitian about   what dietary choices are best for you. Grains Whole-grain or whole-wheat bread. Whole-grain or whole-wheat pasta. Brown rice. Oatmeal. Quinoa. Bulgur. Whole-grain and low-sodium cereals. Pita bread. Low-fat, low-sodium crackers. Whole-wheat flour tortillas. Vegetables Fresh or frozen vegetables (raw, steamed, roasted, or grilled). Low-sodium or reduced-sodium tomato and vegetable juice. Low-sodium or reduced-sodium tomato sauce and tomato paste. Low-sodium or reduced-sodium canned vegetables. Fruits All fresh, dried, or frozen fruit. Canned fruit in natural juice (without  added sugar). Meat and other protein foods Skinless chicken or turkey. Ground chicken or turkey. Pork with fat trimmed off. Fish and seafood. Egg whites. Dried beans, peas, or lentils. Unsalted nuts, nut butters, and seeds. Unsalted canned beans. Lean cuts of beef with fat trimmed off. Low-sodium, lean deli meat. Dairy Low-fat (1%) or fat-free (skim) milk. Fat-free, low-fat, or reduced-fat cheeses. Nonfat, low-sodium ricotta or cottage cheese. Low-fat or nonfat yogurt. Low-fat, low-sodium cheese. Fats and oils Soft margarine without trans fats. Vegetable oil. Low-fat, reduced-fat, or light mayonnaise and salad dressings (reduced-sodium). Canola, safflower, olive, soybean, and sunflower oils. Avocado. Seasoning and other foods Herbs. Spices. Seasoning mixes without salt. Unsalted popcorn and pretzels. Fat-free sweets. What foods are not recommended? The items listed may not be a complete list. Talk with your dietitian about what dietary choices are best for you. Grains Baked goods made with fat, such as croissants, muffins, or some breads. Dry pasta or rice meal packs. Vegetables Creamed or fried vegetables. Vegetables in a cheese sauce. Regular canned vegetables (not low-sodium or reduced-sodium). Regular canned tomato sauce and paste (not low-sodium or reduced-sodium). Regular tomato and vegetable juice (not low-sodium or reduced-sodium). Pickles. Olives. Fruits Canned fruit in a light or heavy syrup. Fried fruit. Fruit in cream or butter sauce. Meat and other protein foods Fatty cuts of meat. Ribs. Fried meat. Bacon. Sausage. Bologna and other processed lunch meats. Salami. Fatback. Hotdogs. Bratwurst. Salted nuts and seeds. Canned beans with added salt. Canned or smoked fish. Whole eggs or egg yolks. Chicken or turkey with skin. Dairy Whole or 2% milk, cream, and half-and-half. Whole or full-fat cream cheese. Whole-fat or sweetened yogurt. Full-fat cheese. Nondairy creamers. Whipped toppings.  Processed cheese and cheese spreads. Fats and oils Butter. Stick margarine. Lard. Shortening. Ghee. Bacon fat. Tropical oils, such as coconut, palm kernel, or palm oil. Seasoning and other foods Salted popcorn and pretzels. Onion salt, garlic salt, seasoned salt, table salt, and sea salt. Worcestershire sauce. Tartar sauce. Barbecue sauce. Teriyaki sauce. Soy sauce, including reduced-sodium. Steak sauce. Canned and packaged gravies. Fish sauce. Oyster sauce. Cocktail sauce. Horseradish that you find on the shelf. Ketchup. Mustard. Meat flavorings and tenderizers. Bouillon cubes. Hot sauce and Tabasco sauce. Premade or packaged marinades. Premade or packaged taco seasonings. Relishes. Regular salad dressings. Where to find more information:  National Heart, Lung, and Blood Institute: www.nhlbi.nih.gov  American Heart Association: www.heart.org Summary  The DASH eating plan is a healthy eating plan that has been shown to reduce high blood pressure (hypertension). It may also reduce your risk for type 2 diabetes, heart disease, and stroke.  With the DASH eating plan, you should limit salt (sodium) intake to 2,300 mg a day. If you have hypertension, you may need to reduce your sodium intake to 1,500 mg a day.  When on the DASH eating plan, aim to eat more fresh fruits and vegetables, whole grains, lean proteins, low-fat dairy, and heart-healthy fats.  Work with your health care provider or diet and nutrition specialist (dietitian) to adjust your eating plan to your   individual calorie needs. This information is not intended to replace advice given to you by your health care provider. Make sure you discuss any questions you have with your health care provider. Document Revised: 02/10/2017 Document Reviewed: 02/22/2016 Elsevier Patient Education  2020 Elsevier Inc.  

## 2019-10-25 ENCOUNTER — Other Ambulatory Visit: Payer: Self-pay | Admitting: Family Medicine

## 2019-10-25 DIAGNOSIS — I10 Essential (primary) hypertension: Secondary | ICD-10-CM

## 2019-10-27 ENCOUNTER — Other Ambulatory Visit: Payer: Self-pay | Admitting: Family Medicine

## 2019-10-27 DIAGNOSIS — E785 Hyperlipidemia, unspecified: Secondary | ICD-10-CM

## 2020-01-21 ENCOUNTER — Encounter: Payer: Self-pay | Admitting: Family Medicine

## 2020-01-21 ENCOUNTER — Ambulatory Visit (INDEPENDENT_AMBULATORY_CARE_PROVIDER_SITE_OTHER): Payer: No Typology Code available for payment source | Admitting: Family Medicine

## 2020-01-21 ENCOUNTER — Other Ambulatory Visit: Payer: Self-pay

## 2020-01-21 VITALS — BP 120/90 | HR 75 | Temp 98.2°F | Resp 18 | Ht 70.0 in | Wt 187.8 lb

## 2020-01-21 DIAGNOSIS — Z1159 Encounter for screening for other viral diseases: Secondary | ICD-10-CM

## 2020-01-21 DIAGNOSIS — E785 Hyperlipidemia, unspecified: Secondary | ICD-10-CM | POA: Diagnosis not present

## 2020-01-21 DIAGNOSIS — Z0189 Encounter for other specified special examinations: Secondary | ICD-10-CM | POA: Diagnosis not present

## 2020-01-21 DIAGNOSIS — I10 Essential (primary) hypertension: Secondary | ICD-10-CM

## 2020-01-21 DIAGNOSIS — R5383 Other fatigue: Secondary | ICD-10-CM | POA: Diagnosis not present

## 2020-01-21 LAB — COMPREHENSIVE METABOLIC PANEL
ALT: 41 U/L (ref 0–53)
AST: 23 U/L (ref 0–37)
Albumin: 4.3 g/dL (ref 3.5–5.2)
Alkaline Phosphatase: 88 U/L (ref 39–117)
BUN: 14 mg/dL (ref 6–23)
CO2: 29 mEq/L (ref 19–32)
Calcium: 8.9 mg/dL (ref 8.4–10.5)
Chloride: 104 mEq/L (ref 96–112)
Creatinine, Ser: 1.24 mg/dL (ref 0.40–1.50)
GFR: 65.57 mL/min (ref >60.00)
Glucose, Bld: 92 mg/dL (ref 70–99)
Potassium: 4.5 mEq/L (ref 3.5–5.1)
Sodium: 139 mEq/L (ref 135–145)
Total Bilirubin: 0.6 mg/dL (ref 0.2–1.2)
Total Protein: 6.9 g/dL (ref 6.0–8.3)

## 2020-01-21 LAB — LIPID PANEL
Cholesterol: 187 mg/dL (ref 0–200)
HDL: 42.3 mg/dL (ref >39.00)
LDL Cholesterol: 124 mg/dL — ABNORMAL HIGH (ref 0–99)
NonHDL: 144.49
Total CHOL/HDL Ratio: 4
Triglycerides: 100 mg/dL (ref 0.0–149.0)
VLDL: 20 mg/dL (ref 0.0–40.0)

## 2020-01-21 LAB — MICROALBUMIN / CREATININE URINE RATIO
Creatinine,U: 104 mg/dL
Microalb Creat Ratio: 0.7 mg/g (ref 0.0–30.0)
Microalb, Ur: <0.7 mg/dL (ref 0.0–1.9)

## 2020-01-21 NOTE — Assessment & Plan Note (Addendum)
Well controlled, no changes to meds. Encouraged heart healthy diet such as the DASH diet and exercise as tolerated.  con't norvasc and metoprolol

## 2020-01-21 NOTE — Progress Notes (Signed)
Patient ID: William Lawrence, male    DOB: Jun 06, 1965  Age: 54 y.o. MRN: [id number]    Subjective:  Subjective  HPI William Lawrence presents for f/u bp and cholesterol.  Pt never started the chol meds because he did not receive his results.   No other complaints   Review of Systems  Constitutional: Negative for appetite change, diaphoresis, fatigue and unexpected weight change.  Eyes: Negative for pain, redness and visual disturbance.  Respiratory: Negative for cough, chest tightness, shortness of breath and wheezing.   Cardiovascular: Negative for chest pain, palpitations and leg swelling.  Endocrine: Negative for cold intolerance, heat intolerance, polydipsia, polyphagia and polyuria.  Genitourinary: Negative for difficulty urinating, dysuria and frequency.  Neurological: Negative for dizziness, light-headedness, numbness and headaches.    History Past Medical History:  Diagnosis Date  . Allergy    no meds   . Hypertension 2021    He has a past surgical history that includes Wisdom tooth extraction.   His family history includes Diabetes in his paternal grandmother; Hypertension in his brother, father, mother, and paternal grandmother; Prostate cancer in his maternal uncle.He reports that he has never smoked. He has never used smokeless tobacco. He reports current alcohol use. He reports that he does not use drugs.  Current Outpatient Medications on File Prior to Visit  Medication Sig Dispense Refill  . amLODipine (NORVASC) 5 MG tablet TAKE 1 TABLET BY MOUTH EVERY DAY 30 tablet 2  . metoprolol succinate (TOPROL-XL) 100 MG 24 hr tablet TAKE 1 TABLET (100 MG TOTAL) BY MOUTH DAILY. TAKE WITH OR IMMEDIATELY FOLLOWING A MEAL. 90 tablet 2  . Multiple Vitamin (MULTIVITAMIN) tablet Take 1 tablet by mouth daily.     No current facility-administered medications on file prior to visit.     Objective:  Objective  Physical Exam Vitals and nursing note reviewed.  Constitutional:      General:  He is sleeping.     Appearance: He is well-developed.  HENT:     Head: Normocephalic and atraumatic.  Eyes:     Pupils: Pupils are equal, round, and reactive to light.  Neck:     Thyroid: No thyromegaly.  Cardiovascular:     Rate and Rhythm: Normal rate and regular rhythm.     Heart sounds: No murmur heard.   Pulmonary:     Effort: Pulmonary effort is normal. No respiratory distress.     Breath sounds: Normal breath sounds. No wheezing or rales.  Chest:     Chest wall: No tenderness.  Musculoskeletal:        General: No tenderness.     Cervical back: Normal range of motion and neck supple.  Skin:    General: Skin is warm and dry.  Neurological:     Mental Status: He is oriented to person, place, and time.  Psychiatric:        Behavior: Behavior normal.        Thought Content: Thought content normal.        Judgment: Judgment normal.    BP 120/90 (BP Location: Left Arm, Patient Position: Sitting, Cuff Size: Normal)   Pulse 75   Temp 98.2 F (36.8 C) (Oral)   Resp 18   Ht 5\' 10"  (1.778 m)   Wt 187 lb 12.8 oz (85.2 kg)   SpO2 99%   BMI 26.95 kg/m  Wt Readings from Last 3 Encounters:  01/21/20 187 lb 12.8 oz (85.2 kg)  10/21/19 190 lb (86.2 kg)  09/30/19 190 lb  12.8 oz (86.5 kg)     Lab Results  Component Value Date   WBC 4.1 05/27/2019   HGB 14.2 05/27/2019   HCT 42.1 05/27/2019   PLT 176.0 05/27/2019   GLUCOSE 103 (H) 10/21/2019   CHOL 190 10/21/2019   TRIG 101.0 10/21/2019   HDL 49.00 10/21/2019   LDLCALC 121 (H) 10/21/2019   ALT 31 10/21/2019   AST 21 10/21/2019   NA 140 10/21/2019   K 3.8 10/21/2019   CL 104 10/21/2019   CREATININE 1.22 10/21/2019   BUN 13 10/21/2019   CO2 29 10/21/2019   TSH 1.62 05/27/2019   PSA 1.91 05/27/2019    DG Foot Complete Right  Result Date: 08/10/2018 CLINICAL DATA:  Anterior foot pain after dropping a chair on it yesterday. EXAM: RIGHT FOOT COMPLETE - 3+ VIEW COMPARISON:  None. FINDINGS: There is no evidence of  fracture or dislocation. There is no evidence of arthropathy or other focal bone abnormality. Soft tissues are unremarkable. IMPRESSION: Negative. Electronically Signed   By: Titus Dubin M.D.   On: 08/10/2018 10:24     Assessment & Plan:  Plan  I am having William Lawrence maintain his multivitamin, metoprolol succinate, and amLODipine.  No orders of the defined types were placed in this encounter.   Problem List Items Addressed This Visit      Unprioritized   Hyperlipidemia   Relevant Orders   Comprehensive metabolic panel   Lipid panel    Other Visit Diagnoses    Hypertension, unspecified type    -  Primary   Relevant Orders   Comprehensive metabolic panel   Lipid panel   Microalbumin / creatinine urine ratio   Need for hepatitis C screening test       Relevant Orders   Hepatitis C antibody      Follow-up: Return in about 6 months (around 07/20/2020), or if symptoms worsen or fail to improve, for hypertension, hyperlipidemia, fasting, annual exam----labs in 3 months.  Ann Held, DO

## 2020-01-21 NOTE — Assessment & Plan Note (Addendum)
Encouraged heart healthy diet, increase exercise, avoid trans fats, consider a krill oil cap daily Lab Results  Component Value Date   CHOL 190 10/21/2019   HDL 49.00 10/21/2019   LDLCALC 121 (H) 10/21/2019   TRIG 101.0 10/21/2019   CHOLHDL 4 10/21/2019   Recheck labs today

## 2020-01-21 NOTE — Patient Instructions (Addendum)
DASH Eating Plan DASH stands for "Dietary Approaches to Stop Hypertension." The DASH eating plan is a healthy eating plan that has been shown to reduce high blood pressure (hypertension). It may also reduce your risk for type 2 diabetes, heart disease, and stroke. The DASH eating plan may also help with weight loss. What are tips for following this plan?  General guidelines  Avoid eating more than 2,300 mg (milligrams) of salt (sodium) a day. If you have hypertension, you may need to reduce your sodium intake to 1,500 mg a day.  Limit alcohol intake to no more than 1 drink a day for nonpregnant women and 2 drinks a day for men. One drink equals 12 oz of beer, 5 oz of wine, or 1 oz of hard liquor.  Work with your health care provider to maintain a healthy body weight or to lose weight. Ask what an ideal weight is for you.  Get at least 30 minutes of exercise that causes your heart to beat faster (aerobic exercise) most days of the week. Activities may include walking, swimming, or biking.  Work with your health care provider or diet and nutrition specialist (dietitian) to adjust your eating plan to your individual calorie needs. Reading food labels   Check food labels for the amount of sodium per serving. Choose foods with less than 5 percent of the Daily Value of sodium. Generally, foods with less than 300 mg of sodium per serving fit into this eating plan.  To find whole grains, look for the word "whole" as the first word in the ingredient list. Shopping  Buy products labeled as "low-sodium" or "no salt added."  Buy fresh foods. Avoid canned foods and premade or frozen meals. Cooking  Avoid adding salt when cooking. Use salt-free seasonings or herbs instead of table salt or sea salt. Check with your health care provider or pharmacist before using salt substitutes.  Do not fry foods. Cook foods using healthy methods such as baking, boiling, grilling, and broiling instead.  Cook with  heart-healthy oils, such as olive, canola, soybean, or sunflower oil. Meal planning  Eat a balanced diet that includes: ? 5 or more servings of fruits and vegetables each day. At each meal, try to fill half of your plate with fruits and vegetables. ? Up to 6-8 servings of whole grains each day. ? Less than 6 oz of lean meat, poultry, or fish each day. A 3-oz serving of meat is about the same size as a deck of cards. One egg equals 1 oz. ? 2 servings of low-fat dairy each day. ? A serving of nuts, seeds, or beans 5 times each week. ? Heart-healthy fats. Healthy fats called Omega-3 fatty acids are found in foods such as flaxseeds and coldwater fish, like sardines, salmon, and mackerel.  Limit how much you eat of the following: ? Canned or prepackaged foods. ? Food that is high in trans fat, such as fried foods. ? Food that is high in saturated fat, such as fatty meat. ? Sweets, desserts, sugary drinks, and other foods with added sugar. ? Full-fat dairy products.  Do not salt foods before eating.  Try to eat at least 2 vegetarian meals each week.  Eat more home-cooked food and less restaurant, buffet, and fast food.  When eating at a restaurant, ask that your food be prepared with less salt or no salt, if possible. What foods are recommended? The items listed may not be a complete list. Talk with your dietitian about   what dietary choices are best for you. Grains Whole-grain or whole-wheat bread. Whole-grain or whole-wheat pasta. Brown rice. Oatmeal. Quinoa. Bulgur. Whole-grain and low-sodium cereals. Pita bread. Low-fat, low-sodium crackers. Whole-wheat flour tortillas. Vegetables Fresh or frozen vegetables (raw, steamed, roasted, or grilled). Low-sodium or reduced-sodium tomato and vegetable juice. Low-sodium or reduced-sodium tomato sauce and tomato paste. Low-sodium or reduced-sodium canned vegetables. Fruits All fresh, dried, or frozen fruit. Canned fruit in natural juice (without  added sugar). Meat and other protein foods Skinless chicken or turkey. Ground chicken or turkey. Pork with fat trimmed off. Fish and seafood. Egg whites. Dried beans, peas, or lentils. Unsalted nuts, nut butters, and seeds. Unsalted canned beans. Lean cuts of beef with fat trimmed off. Low-sodium, lean deli meat. Dairy Low-fat (1%) or fat-free (skim) milk. Fat-free, low-fat, or reduced-fat cheeses. Nonfat, low-sodium ricotta or cottage cheese. Low-fat or nonfat yogurt. Low-fat, low-sodium cheese. Fats and oils Soft margarine without trans fats. Vegetable oil. Low-fat, reduced-fat, or light mayonnaise and salad dressings (reduced-sodium). Canola, safflower, olive, soybean, and sunflower oils. Avocado. Seasoning and other foods Herbs. Spices. Seasoning mixes without salt. Unsalted popcorn and pretzels. Fat-free sweets. What foods are not recommended? The items listed may not be a complete list. Talk with your dietitian about what dietary choices are best for you. Grains Baked goods made with fat, such as croissants, muffins, or some breads. Dry pasta or rice meal packs. Vegetables Creamed or fried vegetables. Vegetables in a cheese sauce. Regular canned vegetables (not low-sodium or reduced-sodium). Regular canned tomato sauce and paste (not low-sodium or reduced-sodium). Regular tomato and vegetable juice (not low-sodium or reduced-sodium). Pickles. Olives. Fruits Canned fruit in a light or heavy syrup. Fried fruit. Fruit in cream or butter sauce. Meat and other protein foods Fatty cuts of meat. Ribs. Fried meat. Bacon. Sausage. Bologna and other processed lunch meats. Salami. Fatback. Hotdogs. Bratwurst. Salted nuts and seeds. Canned beans with added salt. Canned or smoked fish. Whole eggs or egg yolks. Chicken or turkey with skin. Dairy Whole or 2% milk, cream, and half-and-half. Whole or full-fat cream cheese. Whole-fat or sweetened yogurt. Full-fat cheese. Nondairy creamers. Whipped toppings.  Processed cheese and cheese spreads. Fats and oils Butter. Stick margarine. Lard. Shortening. Ghee. Bacon fat. Tropical oils, such as coconut, palm kernel, or palm oil. Seasoning and other foods Salted popcorn and pretzels. Onion salt, garlic salt, seasoned salt, table salt, and sea salt. Worcestershire sauce. Tartar sauce. Barbecue sauce. Teriyaki sauce. Soy sauce, including reduced-sodium. Steak sauce. Canned and packaged gravies. Fish sauce. Oyster sauce. Cocktail sauce. Horseradish that you find on the shelf. Ketchup. Mustard. Meat flavorings and tenderizers. Bouillon cubes. Hot sauce and Tabasco sauce. Premade or packaged marinades. Premade or packaged taco seasonings. Relishes. Regular salad dressings. Where to find more information:  National Heart, Lung, and Blood Institute: www.nhlbi.nih.gov  American Heart Association: www.heart.org Summary  The DASH eating plan is a healthy eating plan that has been shown to reduce high blood pressure (hypertension). It may also reduce your risk for type 2 diabetes, heart disease, and stroke.  With the DASH eating plan, you should limit salt (sodium) intake to 2,300 mg a day. If you have hypertension, you may need to reduce your sodium intake to 1,500 mg a day.  When on the DASH eating plan, aim to eat more fresh fruits and vegetables, whole grains, lean proteins, low-fat dairy, and heart-healthy fats.  Work with your health care provider or diet and nutrition specialist (dietitian) to adjust your eating plan to your   individual calorie needs. This information is not intended to replace advice given to you by your health care provider. Make sure you discuss any questions you have with your health care provider. Document Revised: 02/10/2017 Document Reviewed: 02/22/2016 Elsevier Patient Education  [place].   Cholesterol Content in Foods Cholesterol is a waxy, fat-like substance that helps to carry fat in the blood. The body needs  cholesterol in small amounts, but too much cholesterol can cause damage to the arteries and heart. Most people should eat less than 200 milligrams (mg) of cholesterol a day. Foods with cholesterol  Cholesterol is found in animal-based foods, such as meat, seafood, and dairy. Generally, low-fat dairy and lean meats have less cholesterol than full-fat dairy and fatty meats. The milligrams of cholesterol per serving (mg per serving) of common cholesterol-containing foods are listed below. Meat and other proteins  Egg -- one large whole egg has 186 mg.  Veal shank -- 4 oz has 141 mg.  Lean ground [place] (93% lean) -- 4 oz has 118 mg.  Fat-trimmed lamb loin -- 4 oz has 106 mg.  Lean ground beef (90% lean) -- 4 oz has 100 mg.  Lobster -- 3.5 oz has 90 mg.  Pork loin chops -- 4 oz has 86 mg.  Canned salmon -- 3.5 oz has 83 mg.  Fat-trimmed beef top loin -- 4 oz has 78 mg.  Frankfurter -- 1 frank (3.5 oz) has 77 mg.  Crab -- 3.5 oz has 71 mg.  Roasted chicken without skin, white meat -- 4 oz has 66 mg.  Light bologna -- 2 oz has 45 mg.  Deli-cut [place] -- 2 oz has 31 mg.  Canned tuna -- 3.5 oz has 31 mg.  Berniece Salines -- 1 oz has 29 mg.  Oysters and mussels (raw) -- 3.5 oz has 25 mg.  Mackerel -- 1 oz has 22 mg.  Trout -- 1 oz has 20 mg.  Pork sausage -- 1 link (1 oz) has 17 mg.  Salmon -- 1 oz has 16 mg.  Tilapia -- 1 oz has 14 mg. Dairy  Soft-serve ice cream --  cup (4 oz) has 103 mg.  Whole-milk yogurt -- 1 cup (8 oz) has 29 mg.  Cheddar cheese -- 1 oz has 28 mg.  American cheese -- 1 oz has 28 mg.  Whole milk -- 1 cup (8 oz) has 23 mg.  2% milk -- 1 cup (8 oz) has 18 mg.  Cream cheese -- 1 tablespoon (Tbsp) has 15 mg.  Cottage cheese --  cup (4 oz) has 14 mg.  Low-fat (1%) milk -- 1 cup (8 oz) has 10 mg.  Sour cream -- 1 Tbsp has 8.5 mg.  Low-fat yogurt -- 1 cup (8 oz) has 8 mg.  Nonfat Greek yogurt -- 1 cup (8 oz) has 7 mg.  Half-and-half cream -- 1  Tbsp has 5 mg. Fats and oils  Cod liver oil -- 1 tablespoon (Tbsp) has 82 mg.  Butter -- 1 Tbsp has 15 mg.  Lard -- 1 Tbsp has 14 mg.  Bacon grease -- 1 Tbsp has 14 mg.  Mayonnaise -- 1 Tbsp has 5-10 mg.  Margarine -- 1 Tbsp has 3-10 mg. Exact amounts of cholesterol in these foods may vary depending on specific ingredients and brands. Foods without cholesterol Most plant-based foods do not have cholesterol unless you combine them with a food that has cholesterol. Foods without cholesterol include:  Grains and cereals.  Vegetables.  Fruits.  Vegetable oils, such as olive, canola, and sunflower oil.  Legumes, such as peas, beans, and lentils.  Nuts and seeds.  Egg whites. Summary  The body needs cholesterol in small amounts, but too much cholesterol can cause damage to the arteries and heart.  Most people should eat less than 200 milligrams (mg) of cholesterol a day. This information is not intended to replace advice given to you by your health care provider. Make sure you discuss any questions you have with your health care provider. Document Revised: 02/10/2017 Document Reviewed: 10/25/2016 Elsevier Patient Education  [place].

## 2020-01-22 MED ORDER — ROSUVASTATIN CALCIUM 10 MG PO TABS: 10.0000 mg | ORAL_TABLET | Freq: Every day | ORAL | 2 refills | Status: DC

## 2020-01-22 NOTE — Addendum Note (Signed)
Addended by: Wynonia Musty A on: 01/22/2020 02:17 PM   Modules accepted: Orders

## 2020-01-25 LAB — TESTOS,TOTAL,FREE AND SHBG (FEMALE)
Free Testosterone: 75.8 pg/mL (ref 35.0–155.0)
Sex Hormone Binding: 36 nmol/L (ref 10–50)
Testosterone, Total, LC-MS-MS: 462 ng/dL (ref 250–1100)

## 2020-01-25 LAB — HEPATITIS C ANTIBODY
Hepatitis C Ab: NONREACTIVE
SIGNAL TO CUT-OFF: 0.01 (ref <1.00)

## 2020-01-31 ENCOUNTER — Other Ambulatory Visit: Payer: Self-pay | Admitting: Family Medicine

## 2020-01-31 DIAGNOSIS — I10 Essential (primary) hypertension: Secondary | ICD-10-CM

## 2020-03-24 DIAGNOSIS — U071 COVID-19: Secondary | ICD-10-CM | POA: Diagnosis not present

## 2020-03-24 DIAGNOSIS — Z20822 Contact with and (suspected) exposure to covid-19: Secondary | ICD-10-CM | POA: Diagnosis not present

## 2020-04-15 ENCOUNTER — Other Ambulatory Visit: Payer: Self-pay | Admitting: Family Medicine

## 2020-04-15 DIAGNOSIS — E785 Hyperlipidemia, unspecified: Secondary | ICD-10-CM

## 2020-04-15 DIAGNOSIS — H524 Presbyopia: Secondary | ICD-10-CM | POA: Diagnosis not present

## 2020-04-22 ENCOUNTER — Other Ambulatory Visit (INDEPENDENT_AMBULATORY_CARE_PROVIDER_SITE_OTHER): Payer: No Typology Code available for payment source

## 2020-04-22 ENCOUNTER — Other Ambulatory Visit: Payer: Self-pay | Admitting: Family Medicine

## 2020-04-22 ENCOUNTER — Other Ambulatory Visit: Payer: Self-pay

## 2020-04-22 DIAGNOSIS — I10 Essential (primary) hypertension: Secondary | ICD-10-CM

## 2020-04-22 DIAGNOSIS — E785 Hyperlipidemia, unspecified: Secondary | ICD-10-CM

## 2020-04-22 DIAGNOSIS — R748 Abnormal levels of other serum enzymes: Secondary | ICD-10-CM

## 2020-04-22 LAB — LIPID PANEL
Cholesterol: 141 mg/dL (ref 0–200)
HDL: 41.6 mg/dL (ref >39.00)
LDL Cholesterol: 81 mg/dL (ref 0–99)
NonHDL: 99.02
Total CHOL/HDL Ratio: 3
Triglycerides: 92 mg/dL (ref 0.0–149.0)
VLDL: 18.4 mg/dL (ref 0.0–40.0)

## 2020-04-22 LAB — COMPREHENSIVE METABOLIC PANEL
ALT: 121 U/L — ABNORMAL HIGH (ref 0–53)
AST: 367 U/L — ABNORMAL HIGH (ref 0–37)
Albumin: 4.3 g/dL (ref 3.5–5.2)
Alkaline Phosphatase: 97 U/L (ref 39–117)
BUN: 16 mg/dL (ref 6–23)
CO2: 30 mEq/L (ref 19–32)
Calcium: 9.1 mg/dL (ref 8.4–10.5)
Chloride: 103 mEq/L (ref 96–112)
Creatinine, Ser: 1.35 mg/dL (ref 0.40–1.50)
GFR: 59.11 mL/min — ABNORMAL LOW (ref >60.00)
Glucose, Bld: 90 mg/dL (ref 70–99)
Potassium: 3.9 mEq/L (ref 3.5–5.1)
Sodium: 139 mEq/L (ref 135–145)
Total Bilirubin: 0.6 mg/dL (ref 0.2–1.2)
Total Protein: 7.1 g/dL (ref 6.0–8.3)

## 2020-04-28 ENCOUNTER — Other Ambulatory Visit: Payer: Self-pay

## 2020-04-28 ENCOUNTER — Ambulatory Visit (HOSPITAL_BASED_OUTPATIENT_CLINIC_OR_DEPARTMENT_OTHER)
Admission: RE | Admit: 2020-04-28 | Discharge: 2020-04-28 | Disposition: A | Discharge status: Home / Self Care | Payer: No Typology Code available for payment source | Source: Ambulatory Visit | Attending: Family Medicine | Admitting: Family Medicine

## 2020-04-28 DIAGNOSIS — K7689 Other specified diseases of liver: Secondary | ICD-10-CM | POA: Diagnosis not present

## 2020-04-28 DIAGNOSIS — R748 Abnormal levels of other serum enzymes: Secondary | ICD-10-CM | POA: Insufficient documentation

## 2020-04-28 DIAGNOSIS — D1803 Hemangioma of intra-abdominal structures: Secondary | ICD-10-CM | POA: Diagnosis not present

## 2020-05-29 ENCOUNTER — Other Ambulatory Visit: Payer: Self-pay | Admitting: Family Medicine

## 2020-05-29 DIAGNOSIS — I10 Essential (primary) hypertension: Secondary | ICD-10-CM

## 2020-06-23 ENCOUNTER — Telehealth: Payer: Self-pay | Admitting: Family Medicine

## 2020-06-23 DIAGNOSIS — E785 Hyperlipidemia, unspecified: Secondary | ICD-10-CM

## 2020-06-23 NOTE — Telephone Encounter (Signed)
He needs to see GI because his liver function were through the roof and we need to figure out why

## 2020-06-23 NOTE — Telephone Encounter (Signed)
Yes-- he still needs to see GI and we need to recheck cmp this week Do not restart crestor yet

## 2020-06-23 NOTE — Telephone Encounter (Addendum)
Patient wants to know if he should start back taking crestor & also, wants to know if he needs to still see GI even though he had a normal [place]      Call back :  [phone]

## 2020-06-23 NOTE — Telephone Encounter (Signed)
Caller Gid Schoffstall  Call Back @ [phone]  Patient states he would like a call back in reference to feburary 2022, lab results  Patient would like to know he should continue to take rosuavastin 10MG  ?   Patent would also like to discuss results for ultrasound.

## 2020-06-23 NOTE — Telephone Encounter (Addendum)
I made patient a lab appointment for 06/25/2020 and also, a VV for tomorrow 06/24/20 , he wants to discuss the GI referral   CMP dropped as well

## 2020-06-23 NOTE — Telephone Encounter (Signed)
Called pt and lvm to return call.  

## 2020-06-24 ENCOUNTER — Other Ambulatory Visit: Payer: Self-pay

## 2020-06-24 ENCOUNTER — Telehealth (INDEPENDENT_AMBULATORY_CARE_PROVIDER_SITE_OTHER): Payer: No Typology Code available for payment source | Admitting: Family Medicine

## 2020-06-24 ENCOUNTER — Encounter: Payer: Self-pay | Admitting: Family Medicine

## 2020-06-24 VITALS — BP 118/71 | HR 65 | Temp 98.4°F

## 2020-06-24 DIAGNOSIS — R7989 Other specified abnormal findings of blood chemistry: Secondary | ICD-10-CM | POA: Diagnosis not present

## 2020-06-24 NOTE — Progress Notes (Signed)
MyChart Video Visit    Virtual Visit via Video Note   This visit type was conducted due to national recommendations for restrictions regarding the COVID-19 Pandemic (e.g. social distancing) in an effort to limit this patient's exposure and mitigate transmission in our community. This patient is at least at moderate risk for complications without adequate follow up. This format is felt to be most appropriate for this patient at this time. Physical exam was limited by quality of the video and audio technology used for the visit. William Lawrence was able to get the patient set up on a video visit.  Patient location: home alone  Patient and provider in visit Provider location: Office  I discussed the limitations of evaluation and management by telemedicine and the availability of in person appointments. The patient expressed understanding and agreed to proceed.  Visit Date: 06/24/2020  Today's healthcare provider: Ann Held, DO     Subjective:    Patient ID: William Lawrence, male    DOB: 1965-05-22, 55 y.o.   MRN: [id number]  Chief Complaint  Patient presents with  . Discsuss labs    Pt asks if he is to be taking the Crestor or not, he says he never got any clarity on this. He also asked about seeing [facility]- is it needed? And when he needs to have a f/u here.     HPI Patient is in today for f/u elevated liver function  He stopped the crestor but did not come in for repeat labs   Past Medical History:  Diagnosis Date  . Allergy    no meds   . Hypertension 2021    Past Surgical History:  Procedure Laterality Date  . WISDOM TOOTH EXTRACTION      Family History  Problem Relation Age of Onset  . Diabetes Paternal Grandmother   . Hypertension Paternal Grandmother   . Prostate cancer Maternal Uncle   . Hypertension Mother   . Hypertension Father   . Hypertension Brother   . Colon cancer Neg Hx   . Colon polyps Neg Hx   . Esophageal cancer Neg Hx   . Rectal cancer Neg  Hx   . Stomach cancer Neg Hx     Social History   Socioeconomic History  . Marital status: Married    Spouse name: Not on file  . Number of children: Not on file  . Years of education: Not on file  . Highest education level: Not on file  Occupational History  . Not on file  Tobacco Use  . Smoking status: Never Smoker  . Smokeless tobacco: Never Used  Substance and Sexual Activity  . Alcohol use: Yes    Comment: occ   . Drug use: Never  . Sexual activity: Yes  Other Topics Concern  . Not on file  Social History Narrative  . Not on file   Social Determinants of Health   Financial Resource Strain: Not on file  Food Insecurity: Not on file  Transportation Needs: Not on file  Physical Activity: Not on file  Stress: Not on file  Social Connections: Not on file  Intimate Partner Violence: Not on file    Outpatient Medications Prior to Visit  Medication Sig Dispense Refill  . amLODipine (NORVASC) 5 MG tablet Take 1 tablet (5 mg total) by mouth daily. 90 tablet 1  . metoprolol succinate (TOPROL-XL) 100 MG 24 hr tablet TAKE 1 TABLET (100 MG TOTAL) BY MOUTH DAILY. TAKE WITH OR IMMEDIATELY FOLLOWING  A MEAL. 90 tablet 0  . Multiple Vitamin (MULTIVITAMIN) tablet Take 1 tablet by mouth daily.    . rosuvastatin (CRESTOR) 10 MG tablet TAKE 1 TABLET BY MOUTH EVERY DAY (Patient not taking: No sig reported) 90 tablet 1   No facility-administered medications prior to visit.    No Known Allergies  ROS     Objective:    Physical Exam Vitals and nursing note reviewed.  Constitutional:      Appearance: Normal appearance.  Pulmonary:     Effort: Pulmonary effort is normal.  Neurological:     Mental Status: He is alert.  Psychiatric:        Mood and Affect: Mood normal.        Behavior: Behavior normal.        Thought Content: Thought content normal.     BP 118/71 (BP Location: Right Arm, Patient Position: Sitting, Cuff Size: Normal)   Pulse 65   Temp 98.4 F (36.9 C)  (Oral)  Wt Readings from Last 3 Encounters:  01/21/20 187 lb 12.8 oz (85.2 kg)  10/21/19 190 lb (86.2 kg)  09/30/19 190 lb 12.8 oz (86.5 kg)    Diabetic Foot Exam - Simple   No data filed    Lab Results  Component Value Date   WBC 4.1 05/27/2019   HGB 14.2 05/27/2019   HCT 42.1 05/27/2019   PLT 176.0 05/27/2019   GLUCOSE 90 04/22/2020   CHOL 141 04/22/2020   TRIG 92.0 04/22/2020   HDL 41.60 04/22/2020   LDLCALC 81 04/22/2020   ALT 121 (H) 04/22/2020   AST 367 (H) 04/22/2020   NA 139 04/22/2020   K 3.9 04/22/2020   CL 103 04/22/2020   CREATININE 1.35 04/22/2020   BUN 16 04/22/2020   CO2 30 04/22/2020   TSH 1.62 05/27/2019   PSA 1.91 05/27/2019   MICROALBUR <0.7 01/21/2020    Lab Results  Component Value Date   TSH 1.62 05/27/2019   Lab Results  Component Value Date   WBC 4.1 05/27/2019   HGB 14.2 05/27/2019   HCT 42.1 05/27/2019   MCV 95.1 05/27/2019   PLT 176.0 05/27/2019   Lab Results  Component Value Date   NA 139 04/22/2020   K 3.9 04/22/2020   CO2 30 04/22/2020   GLUCOSE 90 04/22/2020   BUN 16 04/22/2020   CREATININE 1.35 04/22/2020   BILITOT 0.6 04/22/2020   ALKPHOS 97 04/22/2020   AST 367 (H) 04/22/2020   ALT 121 (H) 04/22/2020   PROT 7.1 04/22/2020   ALBUMIN 4.3 04/22/2020   CALCIUM 9.1 04/22/2020   GFR 59.11 (L) 04/22/2020   Lab Results  Component Value Date   CHOL 141 04/22/2020   Lab Results  Component Value Date   HDL 41.60 04/22/2020   Lab Results  Component Value Date   LDLCALC 81 04/22/2020   Lab Results  Component Value Date   TRIG 92.0 04/22/2020   Lab Results  Component Value Date   CHOLHDL 3 04/22/2020   No results found for: HGBA1C     Assessment & Plan:   Problem List Items Addressed This Visit   None     I am having William Lawrence maintain his multivitamin, amLODipine, rosuvastatin, and metoprolol succinate.  No orders of the defined types were placed in this encounter.   I discussed the assessment  and treatment plan with the patient. The patient was provided an opportunity to ask questions and all were answered. The patient agreed with  the plan and demonstrated an understanding of the instructions.   The patient was advised to call back or seek an in-person evaluation if the symptoms worsen or if the condition fails to improve as anticipated.    Ann Held, DO [address] [facility] [phone] (phone) [phone] (fax)  [place]

## 2020-06-25 ENCOUNTER — Other Ambulatory Visit: Payer: Self-pay

## 2020-06-25 ENCOUNTER — Other Ambulatory Visit: Payer: Self-pay | Admitting: Family Medicine

## 2020-06-25 ENCOUNTER — Other Ambulatory Visit (INDEPENDENT_AMBULATORY_CARE_PROVIDER_SITE_OTHER): Payer: No Typology Code available for payment source

## 2020-06-25 DIAGNOSIS — R350 Frequency of micturition: Secondary | ICD-10-CM

## 2020-06-25 DIAGNOSIS — R8290 Unspecified abnormal findings in urine: Secondary | ICD-10-CM

## 2020-06-25 DIAGNOSIS — E785 Hyperlipidemia, unspecified: Secondary | ICD-10-CM | POA: Diagnosis not present

## 2020-06-25 DIAGNOSIS — R748 Abnormal levels of other serum enzymes: Secondary | ICD-10-CM

## 2020-06-25 LAB — COMPREHENSIVE METABOLIC PANEL
ALT: 39 U/L (ref 0–53)
AST: 21 U/L (ref 0–37)
Albumin: 3.9 g/dL (ref 3.5–5.2)
Alkaline Phosphatase: 82 U/L (ref 39–117)
BUN: 16 mg/dL (ref 6–23)
CO2: 29 mEq/L (ref 19–32)
Calcium: 8.8 mg/dL (ref 8.4–10.5)
Chloride: 103 mEq/L (ref 96–112)
Creatinine, Ser: 1.3 mg/dL (ref 0.40–1.50)
GFR: 61.77 mL/min (ref >60.00)
Glucose, Bld: 112 mg/dL — ABNORMAL HIGH (ref 70–99)
Potassium: 4.3 mEq/L (ref 3.5–5.1)
Sodium: 138 mEq/L (ref 135–145)
Total Bilirubin: 0.6 mg/dL (ref 0.2–1.2)
Total Protein: 6.8 g/dL (ref 6.0–8.3)

## 2020-06-25 LAB — POC URINALSYSI DIPSTICK (AUTOMATED)
Bilirubin, UA: NEGATIVE
Blood, UA: NEGATIVE
Glucose, UA: NEGATIVE
Ketones, UA: NEGATIVE
Leukocytes, UA: NEGATIVE
Nitrite, UA: NEGATIVE
Protein, UA: NEGATIVE
Spec Grav, UA: 1.02
Urobilinogen, UA: 0.2 U/dL
pH, UA: 6

## 2020-06-25 NOTE — Progress Notes (Signed)
[facility]

## 2020-06-25 NOTE — Addendum Note (Signed)
Addended by: Kelle Darting A on: 06/25/2020 10:03 AM   Modules accepted: Orders

## 2020-07-20 ENCOUNTER — Encounter: Payer: No Typology Code available for payment source | Admitting: Family Medicine

## 2020-08-12 ENCOUNTER — Other Ambulatory Visit: Payer: Self-pay

## 2020-08-13 ENCOUNTER — Encounter: Payer: Self-pay | Admitting: Family Medicine

## 2020-08-13 ENCOUNTER — Other Ambulatory Visit: Payer: Self-pay

## 2020-08-13 ENCOUNTER — Ambulatory Visit (INDEPENDENT_AMBULATORY_CARE_PROVIDER_SITE_OTHER): Payer: 59 | Admitting: Family Medicine

## 2020-08-13 VITALS — BP 121/81 | HR 66 | Temp 98.9°F | Resp 18 | Ht 70.0 in | Wt 188.6 lb

## 2020-08-13 DIAGNOSIS — Z0000 Encounter for general adult medical examination without abnormal findings: Secondary | ICD-10-CM

## 2020-08-13 DIAGNOSIS — I10 Essential (primary) hypertension: Secondary | ICD-10-CM

## 2020-08-13 DIAGNOSIS — E785 Hyperlipidemia, unspecified: Secondary | ICD-10-CM | POA: Diagnosis not present

## 2020-08-13 DIAGNOSIS — Z125 Encounter for screening for malignant neoplasm of prostate: Secondary | ICD-10-CM | POA: Diagnosis not present

## 2020-08-13 LAB — CBC WITH DIFFERENTIAL/PLATELET
Basophils Absolute: 0 10*3/uL (ref 0.0–0.1)
Basophils Relative: 0.8 % (ref 0.0–3.0)
Eosinophils Absolute: 0.1 10*3/uL (ref 0.0–0.7)
Eosinophils Relative: 2.7 % (ref 0.0–5.0)
HCT: 39.8 % (ref 39.0–52.0)
Hemoglobin: 13.6 g/dL (ref 13.0–17.0)
Lymphocytes Relative: 39.3 % (ref 12.0–46.0)
Lymphs Abs: 1.8 10*3/uL (ref 0.7–4.0)
MCHC: 34.1 g/dL (ref 30.0–36.0)
MCV: 95.2 fl (ref 78.0–100.0)
Monocytes Absolute: 0.7 10*3/uL (ref 0.1–1.0)
Monocytes Relative: 14.6 % — ABNORMAL HIGH (ref 3.0–12.0)
Neutro Abs: 2 10*3/uL (ref 1.4–7.7)
Neutrophils Relative %: 42.6 % — ABNORMAL LOW (ref 43.0–77.0)
Platelets: 178 10*3/uL (ref 150.0–400.0)
RBC: 4.18 Mil/uL — ABNORMAL LOW (ref 4.22–5.81)
RDW: 13 % (ref 11.5–15.5)
WBC: 4.6 10*3/uL (ref 4.0–10.5)

## 2020-08-13 LAB — PSA: PSA: 3.41 ng/mL (ref 0.10–4.00)

## 2020-08-13 LAB — TSH: TSH: 1.19 u[IU]/mL (ref 0.35–4.50)

## 2020-08-13 MED ORDER — AMLODIPINE BESYLATE 5 MG PO TABS: 5.0000 mg | ORAL_TABLET | Freq: Every day | ORAL | 1 refills | Status: DC

## 2020-08-13 MED ORDER — METOPROLOL SUCCINATE ER 100 MG PO TB24
100.0000 mg | ORAL_TABLET | Freq: Every day | ORAL | 1 refills | Status: DC
Start: 2020-08-13 — End: 2021-02-10

## 2020-08-13 MED ORDER — ROSUVASTATIN CALCIUM 10 MG PO TABS: 10.0000 mg | ORAL_TABLET | Freq: Every day | ORAL | 1 refills | Status: DC

## 2020-08-13 NOTE — Assessment & Plan Note (Addendum)
ghm utd Check labs  Pt thinks he had tetanus-- he will look at home and let us know He is refusing covid booster for right now  He is refusing shingles right now too

## 2020-08-13 NOTE — Patient Instructions (Signed)

## 2020-08-13 NOTE — Progress Notes (Signed)
Subjective:   By signing my name below, I, Shehryar Baig, attest that this documentation has been prepared under the direction and in the presence of Dr. Roma Schanz, DO. 08/13/2020      Patient ID: William Lawrence, male    DOB: 12-Apr-1965, 55 y.o.   MRN: [id number]  Chief Complaint  Patient presents with  . Annual Exam    HIV, Hep C screens due Tdap:  Covid Booster: Concerns/questions: none     HPI Patient is in today for a comprehensive physical exam.  He denies having any new skin changes, joint pain at this time. He had no recent change in his family or surgical history. He is UTD on vision care. He has 2 pfizer Covid-19 vaccines at this time and is not willing to get anymore booster vaccines. He is not willing to get shingles vaccine at this time. He received the tetanus vaccine from another facility.  His blood pressure is doing well at this time. He continues taking 5 mg amlodipine daily PO and 100 mg metoprolol succinate daily PO to manage his blood pressure. He is requesting a refill for both medication blood pressure medications. BP Readings from Last 3 Encounters:  08/13/20 121/81  06/24/20 118/71  01/21/20 120/90    He started taking 10 mg Crestor daily PO to manage his cholesterol level. He is also requesting a refill for Crestor as well. Lab Results  Component Value Date   CHOL 141 04/22/2020   HDL 41.60 04/22/2020   LDLCALC 81 04/22/2020   TRIG 92.0 04/22/2020   CHOLHDL 3 04/22/2020     Past Medical History:  Diagnosis Date  . Allergy    no meds   . Hypertension 2021    Past Surgical History:  Procedure Laterality Date  . WISDOM TOOTH EXTRACTION      Family History  Problem Relation Age of Onset  . Diabetes Paternal Grandmother   . Hypertension Paternal Grandmother   . Prostate cancer Maternal Uncle   . Hypertension Mother   . Hypertension Father   . Hypertension Brother   . Colon cancer Neg Hx   . Colon polyps Neg Hx   . Esophageal  cancer Neg Hx   . Rectal cancer Neg Hx   . Stomach cancer Neg Hx     Social History   Socioeconomic History  . Marital status: Married    Spouse name: Not on file  . Number of children: Not on file  . Years of education: Not on file  . Highest education level: Not on file  Occupational History  . Not on file  Tobacco Use  . Smoking status: Never Smoker  . Smokeless tobacco: Never Used  Substance and Sexual Activity  . Alcohol use: Yes    Comment: occ   . Drug use: Never  . Sexual activity: Yes  Other Topics Concern  . Not on file  Social History Narrative  . Not on file   Social Determinants of Health   Financial Resource Strain: Not on file  Food Insecurity: Not on file  Transportation Needs: Not on file  Physical Activity: Not on file  Stress: Not on file  Social Connections: Not on file  Intimate Partner Violence: Not on file    Outpatient Medications Prior to Visit  Medication Sig Dispense Refill  . Multiple Vitamin (MULTIVITAMIN) tablet Take 1 tablet by mouth daily.    Marland Kitchen amLODipine (NORVASC) 5 MG tablet Take 1 tablet (5 mg total) by mouth daily. [place]  tablet 1  . metoprolol succinate (TOPROL-XL) 100 MG 24 hr tablet TAKE 1 TABLET (100 MG TOTAL) BY MOUTH DAILY. TAKE WITH OR IMMEDIATELY FOLLOWING A MEAL. 90 tablet 0  . rosuvastatin (CRESTOR) 10 MG tablet TAKE 1 TABLET BY MOUTH EVERY DAY 90 tablet 1   No facility-administered medications prior to visit.   Health Maintenance  Topic Date Due  . HIV Screening  Never done  . TETANUS/TDAP  Never done  . COVID-19 Vaccine (3 - Booster for Pfizer series) 08/29/2020 (Originally 11/24/2019)  . Zoster Vaccines- Shingrix (1 of 2) 11/13/2020 (Originally 10/21/2014)  . INFLUENZA VACCINE  10/12/2020  . URINE MICROALBUMIN  01/20/2021  . COLONOSCOPY (Pts 45-11yrs Insurance coverage will need to be confirmed)  08/29/2022  . Hepatitis C Screening  Completed  . HPV VACCINES  Aged Out    No Known Allergies  Review of Systems   Constitutional: Negative for fever and malaise/fatigue.  HENT: Negative for congestion.   Eyes: Negative for blurred vision.  Respiratory: Negative for shortness of breath.   Cardiovascular: Negative for chest pain, palpitations and leg swelling.  Gastrointestinal: Negative for abdominal pain, blood in stool and nausea.  Genitourinary: Negative for dysuria and frequency.  Musculoskeletal: Negative for falls and joint pain.  Skin: Negative for rash.       (-)Skin changes  Neurological: Negative for dizziness, loss of consciousness and headaches.  Endo/Heme/Allergies: Negative for environmental allergies.  Psychiatric/Behavioral: Negative for depression. The patient is not nervous/anxious.        Objective:    Physical Exam Vitals and nursing note reviewed.  Constitutional:      General: He is not in acute distress.    Appearance: Normal appearance. He is not ill-appearing.  HENT:     Head: Normocephalic and atraumatic.     Right Ear: Tympanic membrane and external ear normal.     Left Ear: Tympanic membrane and external ear normal.  Eyes:     Extraocular Movements: Extraocular movements intact.     Pupils: Pupils are equal, round, and reactive to light.  Cardiovascular:     Rate and Rhythm: Normal rate and regular rhythm.     Pulses: Normal pulses.     Heart sounds: Normal heart sounds. No murmur heard. No gallop.   Pulmonary:     Effort: Pulmonary effort is normal. No respiratory distress.     Breath sounds: Normal breath sounds. No wheezing, rhonchi or rales.  Abdominal:     General: Bowel sounds are normal. There is no distension.     Palpations: Abdomen is soft. There is no mass.     Tenderness: There is no abdominal tenderness. There is no guarding or rebound.     Hernia: No hernia is present.  Skin:    General: Skin is warm and dry.  Neurological:     Mental Status: He is alert and oriented to person, place, and time.  Psychiatric:        Behavior: Behavior  normal.     BP 121/81 (BP Location: Right Wrist, Patient Position: Sitting, Cuff Size: Normal) Comment (BP Location): automatic cuff  Pulse 66   Temp 98.9 F (37.2 C) (Oral)   Resp 18   Ht 5\' 10"  (1.778 m)   Wt 188 lb 9.6 oz (85.5 kg)   SpO2 99%   BMI 27.06 kg/m  Wt Readings from Last 3 Encounters:  08/13/20 188 lb 9.6 oz (85.5 kg)  01/21/20 187 lb 12.8 oz (85.2 kg)  10/21/19 190 lb (  86.2 kg)    Diabetic Foot Exam - Simple   No data filed    Lab Results  Component Value Date   WBC 4.1 05/27/2019   HGB 14.2 05/27/2019   HCT 42.1 05/27/2019   PLT 176.0 05/27/2019   GLUCOSE 112 (H) 06/25/2020   CHOL 141 04/22/2020   TRIG 92.0 04/22/2020   HDL 41.60 04/22/2020   LDLCALC 81 04/22/2020   ALT 39 06/25/2020   AST 21 06/25/2020   NA 138 06/25/2020   K 4.3 06/25/2020   CL 103 06/25/2020   CREATININE 1.30 06/25/2020   BUN 16 06/25/2020   CO2 29 06/25/2020   TSH 1.62 05/27/2019   PSA 1.91 05/27/2019   MICROALBUR <0.7 01/21/2020    Lab Results  Component Value Date   TSH 1.62 05/27/2019   Lab Results  Component Value Date   WBC 4.1 05/27/2019   HGB 14.2 05/27/2019   HCT 42.1 05/27/2019   MCV 95.1 05/27/2019   PLT 176.0 05/27/2019   Lab Results  Component Value Date   NA 138 06/25/2020   K 4.3 06/25/2020   CO2 29 06/25/2020   GLUCOSE 112 (H) 06/25/2020   BUN 16 06/25/2020   CREATININE 1.30 06/25/2020   BILITOT 0.6 06/25/2020   ALKPHOS 82 06/25/2020   AST 21 06/25/2020   ALT 39 06/25/2020   PROT 6.8 06/25/2020   ALBUMIN 3.9 06/25/2020   CALCIUM 8.8 06/25/2020   GFR 61.77 06/25/2020   Lab Results  Component Value Date   CHOL 141 04/22/2020   Lab Results  Component Value Date   HDL 41.60 04/22/2020   Lab Results  Component Value Date   LDLCALC 81 04/22/2020   Lab Results  Component Value Date   TRIG 92.0 04/22/2020   Lab Results  Component Value Date   CHOLHDL 3 04/22/2020   No results found for: HGBA1C  PSA- March, 2021.  Due. Colonoscopy- Last completed 08/29/2019. Results showed one 3 mm polyp in ascending colon, three 4-7 mm polyps in the transverse colon, diverticulosis in the sigmoid colon, internal hemorrhoids, otherwise results normal. Repeat in 3 years.      Assessment & Plan:   Problem List Items Addressed This Visit      Unprioritized   Essential hypertension    Well controlled, no changes to meds. Encouraged heart healthy diet such as the DASH diet and exercise as tolerated.       Relevant Medications   amLODipine (NORVASC) 5 MG tablet   metoprolol succinate (TOPROL-XL) 100 MG 24 hr tablet   rosuvastatin (CRESTOR) 10 MG tablet   Hyperlipidemia    Encouraged heart healthy diet, increase exercise, avoid trans fats, consider a krill oil cap daily      Relevant Medications   amLODipine (NORVASC) 5 MG tablet   metoprolol succinate (TOPROL-XL) 100 MG 24 hr tablet   rosuvastatin (CRESTOR) 10 MG tablet   Other Relevant Orders   Lipid panel   Comprehensive metabolic panel   Microalbumin / creatinine urine ratio   Preventative health care - Primary    ghm utd Check labs       Relevant Orders   Lipid panel   CBC with Differential/Platelet   PSA   TSH   Comprehensive metabolic panel   Microalbumin / creatinine urine ratio    Other Visit Diagnoses    Primary hypertension       Relevant Medications   amLODipine (NORVASC) 5 MG tablet   metoprolol succinate (TOPROL-XL) 100 MG 24  hr tablet   rosuvastatin (CRESTOR) 10 MG tablet   Other Relevant Orders   Lipid panel   CBC with Differential/Platelet   Comprehensive metabolic panel   Microalbumin / creatinine urine ratio       Meds ordered this encounter  Medications  . amLODipine (NORVASC) 5 MG tablet    Sig: Take 1 tablet (5 mg total) by mouth daily.    Dispense:  90 tablet    Refill:  1  . metoprolol succinate (TOPROL-XL) 100 MG 24 hr tablet    Sig: Take 1 tablet (100 mg total) by mouth daily. Take with or immediately following  a meal.    Dispense:  90 tablet    Refill:  1    DX Code Needed  .  . rosuvastatin (CRESTOR) 10 MG tablet    Sig: Take 1 tablet (10 mg total) by mouth daily.    Dispense:  90 tablet    Refill:  1    I, Dr. Roma Schanz, DO, personally preformed the services described in this documentation.  All medical record entries made by the scribe were at my direction and in my presence.  I have reviewed the chart and discharge instructions (if applicable) and agree that the record reflects my personal performance and is accurate and complete.08/13/2020   I,Shehryar Baig,acting as a scribe for Ann Held, DO.,have documented all relevant documentation on the behalf of Ann Held, DO,as directed by  Ann Held, DO while in the presence of Ann Held, DO.   Ann Held, DO

## 2020-08-13 NOTE — Assessment & Plan Note (Signed)
Well controlled, no changes to meds. Encouraged heart healthy diet such as the DASH diet and exercise as tolerated.  °

## 2020-08-13 NOTE — Assessment & Plan Note (Signed)
Encouraged heart healthy diet, increase exercise, avoid trans fats, consider a krill oil cap daily 

## 2020-08-14 LAB — LIPID PANEL
Cholesterol: 155 mg/dL (ref 0–200)
HDL: 57.3 mg/dL (ref >39.00)
LDL Cholesterol: 81 mg/dL (ref 0–99)
NonHDL: 98
Total CHOL/HDL Ratio: 3
Triglycerides: 84 mg/dL (ref 0.0–149.0)
VLDL: 16.8 mg/dL (ref 0.0–40.0)

## 2020-08-14 LAB — COMPREHENSIVE METABOLIC PANEL
ALT: 42 U/L (ref 0–53)
AST: 26 U/L (ref 0–37)
Albumin: 4.4 g/dL (ref 3.5–5.2)
Alkaline Phosphatase: 101 U/L (ref 39–117)
BUN: 14 mg/dL (ref 6–23)
CO2: 26 mEq/L (ref 19–32)
Calcium: 9.3 mg/dL (ref 8.4–10.5)
Chloride: 104 mEq/L (ref 96–112)
Creatinine, Ser: 1.27 mg/dL (ref 0.40–1.50)
GFR: 63.46 mL/min (ref >60.00)
Glucose, Bld: 69 mg/dL — ABNORMAL LOW (ref 70–99)
Potassium: 4.3 mEq/L (ref 3.5–5.1)
Sodium: 142 mEq/L (ref 135–145)
Total Bilirubin: 0.5 mg/dL (ref 0.2–1.2)
Total Protein: 7.3 g/dL (ref 6.0–8.3)

## 2020-08-17 LAB — MICROALBUMIN / CREATININE URINE RATIO
Creatinine,U: 92.3 mg/dL
Microalb Creat Ratio: 0.8 mg/g (ref 0.0–30.0)
Microalb, Ur: <0.7 mg/dL (ref 0.0–1.9)

## 2020-10-28 NOTE — Addendum Note (Signed)
Encounter addended by: Annie Paras on: 10/28/2020 9:35 AM  Actions taken: Letter saved

## 2021-02-10 ENCOUNTER — Other Ambulatory Visit: Payer: Self-pay | Admitting: Family Medicine

## 2021-02-10 DIAGNOSIS — I10 Essential (primary) hypertension: Secondary | ICD-10-CM

## 2021-02-15 ENCOUNTER — Ambulatory Visit: Payer: 59 | Admitting: Family Medicine

## 2021-02-16 ENCOUNTER — Ambulatory Visit (INDEPENDENT_AMBULATORY_CARE_PROVIDER_SITE_OTHER): Payer: 59 | Admitting: Family Medicine

## 2021-02-16 ENCOUNTER — Other Ambulatory Visit: Payer: Self-pay

## 2021-02-16 ENCOUNTER — Encounter: Payer: Self-pay | Admitting: Family Medicine

## 2021-02-16 VITALS — BP 128/70 | HR 73 | Temp 98.3°F | Resp 12 | Ht 70.0 in | Wt 186.4 lb

## 2021-02-16 DIAGNOSIS — E785 Hyperlipidemia, unspecified: Secondary | ICD-10-CM | POA: Diagnosis not present

## 2021-02-16 DIAGNOSIS — J069 Acute upper respiratory infection, unspecified: Secondary | ICD-10-CM

## 2021-02-16 DIAGNOSIS — I10 Essential (primary) hypertension: Secondary | ICD-10-CM | POA: Diagnosis not present

## 2021-02-16 MED ORDER — FLUTICASONE PROPIONATE 50 MCG/ACT NA SUSP: 2.0000 | Freq: Every day | NASAL | 6 refills | Status: DC

## 2021-02-16 MED ORDER — LEVOCETIRIZINE DIHYDROCHLORIDE 5 MG PO TABS: 5.0000 mg | ORAL_TABLET | Freq: Every evening | ORAL | 2 refills | Status: DC

## 2021-02-16 NOTE — Assessment & Plan Note (Signed)
Well controlled, no changes to meds. Encouraged heart healthy diet such as the DASH diet and exercise as tolerated.  °

## 2021-02-16 NOTE — Assessment & Plan Note (Signed)
flonase and antihistamine Call or rto prn

## 2021-02-16 NOTE — Patient Instructions (Signed)

## 2021-02-16 NOTE — Assessment & Plan Note (Signed)
Tolerating statin, encouraged heart healthy diet, avoid trans fats, minimize simple carbs and saturated fats. Increase exercise as tolerated 

## 2021-02-16 NOTE — Progress Notes (Addendum)
Subjective:   By signing my name below, I, Shehryar Baig, attest that this documentation has been prepared under the direction and in the presence of Dr. Roma Schanz, DO. 02/16/2021   Patient ID: William Lawrence, male    DOB: 18-Dec-1965, 55 y.o.   MRN: [id number]  Chief Complaint  Patient presents with   Hypertension   Hyperlipidemia    Hypertension Pertinent negatives include no blurred vision, chest pain, headaches, malaise/fatigue, palpitations or shortness of breath.  Hyperlipidemia Pertinent negatives include no chest pain or shortness of breath.  Patient is in today for a office visit.   His blood pressure is elevated during this visit. He measured it last week and reports it read 107/79. He thinks stress from his daily life might have contributed to it. He did not change his diet recently. He denies having any headaches or chest pain at this time. He reports having congestion and sinus pressure for the past 8 days. He did not take any medication at this time to manage it. He has taken Nyquil a couple of times at night to manage his symptoms.    Past Medical History:  Diagnosis Date   Allergy    no meds    Hypertension 2021    Past Surgical History:  Procedure Laterality Date   WISDOM TOOTH EXTRACTION      Family History  Problem Relation Age of Onset   Diabetes Paternal Grandmother    Hypertension Paternal Grandmother    Prostate cancer Maternal Uncle    Hypertension Mother    Hypertension Father    Hypertension Brother    Colon cancer Neg Hx    Colon polyps Neg Hx    Esophageal cancer Neg Hx    Rectal cancer Neg Hx    Stomach cancer Neg Hx     Social History   Socioeconomic History   Marital status: Married    Spouse name: Not on file   Number of children: Not on file   Years of education: Not on file   Highest education level: Not on file  Occupational History   Not on file  Tobacco Use   Smoking status: Never   Smokeless tobacco: Never   Substance and Sexual Activity   Alcohol use: Yes    Comment: occ    Drug use: Never   Sexual activity: Yes  Other Topics Concern   Not on file  Social History Narrative   Not on file   Social Determinants of Health   Financial Resource Strain: Not on file  Food Insecurity: Not on file  Transportation Needs: Not on file  Physical Activity: Not on file  Stress: Not on file  Social Connections: Not on file  Intimate Partner Violence: Not on file    Outpatient Medications Prior to Visit  Medication Sig Dispense Refill   amLODipine (NORVASC) 5 MG tablet Take 1 tablet (5 mg total) by mouth daily. 90 tablet 1   metoprolol succinate (TOPROL-XL) 100 MG 24 hr tablet TAKE 1 TABLET BY MOUTH DAILY. TAKE WITH OR IMMEDIATELY FOLLOWING A MEAL. 90 tablet 1   Multiple Vitamin (MULTIVITAMIN) tablet Take 1 tablet by mouth daily.     rosuvastatin (CRESTOR) 10 MG tablet Take 1 tablet (10 mg total) by mouth daily. 90 tablet 1   No facility-administered medications prior to visit.    No Known Allergies  Review of Systems  Constitutional:  Negative for fever and malaise/fatigue.  HENT:  Positive for congestion and sinus pain.  Eyes:  Negative for blurred vision.  Respiratory:  Negative for cough and shortness of breath.   Cardiovascular:  Negative for chest pain, palpitations and leg swelling.  Gastrointestinal:  Negative for vomiting.  Musculoskeletal:  Negative for back pain.  Skin:  Negative for rash.  Neurological:  Negative for loss of consciousness and headaches.      Objective:    Physical Exam Vitals and nursing note reviewed.  Constitutional:      General: He is not in acute distress.    Appearance: Normal appearance. He is not ill-appearing.  HENT:     Head: Normocephalic and atraumatic.     Right Ear: External ear normal.     Left Ear: External ear normal.     Nose:     Right Sinus: Maxillary sinus tenderness and frontal sinus tenderness present.     Left Sinus:  Maxillary sinus tenderness and frontal sinus tenderness present.  Eyes:     Extraocular Movements: Extraocular movements intact.     Pupils: Pupils are equal, round, and reactive to light.  Cardiovascular:     Rate and Rhythm: Normal rate and regular rhythm.     Heart sounds: Normal heart sounds. No murmur heard.   No gallop.  Pulmonary:     Effort: Pulmonary effort is normal. No respiratory distress.     Breath sounds: Normal breath sounds. No wheezing or rales.  Skin:    General: Skin is warm and dry.  Neurological:     Mental Status: He is alert and oriented to person, place, and time.  Psychiatric:        Behavior: Behavior normal.    BP 128/70 (BP Location: Left Arm, Patient Position: Sitting, Cuff Size: Small)   Pulse 73   Temp 98.3 F (36.8 C) (Oral)   Resp 12   Ht 5\' 10"  (1.778 m)   Wt 186 lb 6.4 oz (84.6 kg)   SpO2 100%   BMI 26.75 kg/m  Wt Readings from Last 3 Encounters:  02/16/21 186 lb 6.4 oz (84.6 kg)  08/13/20 188 lb 9.6 oz (85.5 kg)  01/21/20 187 lb 12.8 oz (85.2 kg)    Diabetic Foot Exam - Simple   No data filed    Lab Results  Component Value Date   WBC 4.6 08/13/2020   HGB 13.6 08/13/2020   HCT 39.8 08/13/2020   PLT 178.0 08/13/2020   GLUCOSE 69 (L) 08/13/2020   CHOL 155 08/13/2020   TRIG 84.0 08/13/2020   HDL 57.30 08/13/2020   LDLCALC 81 08/13/2020   ALT 42 08/13/2020   AST 26 08/13/2020   NA 142 08/13/2020   K 4.3 08/13/2020   CL 104 08/13/2020   CREATININE 1.27 08/13/2020   BUN 14 08/13/2020   CO2 26 08/13/2020   TSH 1.19 08/13/2020   PSA 3.41 08/13/2020   MICROALBUR <0.7 08/13/2020    Lab Results  Component Value Date   TSH 1.19 08/13/2020   Lab Results  Component Value Date   WBC 4.6 08/13/2020   HGB 13.6 08/13/2020   HCT 39.8 08/13/2020   MCV 95.2 08/13/2020   PLT 178.0 08/13/2020   Lab Results  Component Value Date   NA 142 08/13/2020   K 4.3 08/13/2020   CO2 26 08/13/2020   GLUCOSE 69 (L) 08/13/2020   BUN 14  08/13/2020   CREATININE 1.27 08/13/2020   BILITOT 0.5 08/13/2020   ALKPHOS 101 08/13/2020   AST 26 08/13/2020   ALT 42 08/13/2020  PROT 7.3 08/13/2020   ALBUMIN 4.4 08/13/2020   CALCIUM 9.3 08/13/2020   GFR 63.46 08/13/2020   Lab Results  Component Value Date   CHOL 155 08/13/2020   Lab Results  Component Value Date   HDL 57.30 08/13/2020   Lab Results  Component Value Date   LDLCALC 81 08/13/2020   Lab Results  Component Value Date   TRIG 84.0 08/13/2020   Lab Results  Component Value Date   CHOLHDL 3 08/13/2020   No results found for: HGBA1C     Assessment & Plan:   Problem List Items Addressed This Visit       Unprioritized   Hyperlipidemia - Primary   Relevant Orders   Comprehensive metabolic panel   Lipid panel   Other Visit Diagnoses     Primary hypertension       Relevant Orders   Comprehensive metabolic panel   Lipid panel   Viral upper respiratory tract infection       Relevant Medications   fluticasone (FLONASE) 50 MCG/ACT nasal spray   levocetirizine (XYZAL) 5 MG tablet        Meds ordered this encounter  Medications   fluticasone (FLONASE) 50 MCG/ACT nasal spray    Sig: Place 2 sprays into both nostrils daily.    Dispense:  16 g    Refill:  6   levocetirizine (XYZAL) 5 MG tablet    Sig: Take 1 tablet (5 mg total) by mouth every evening.    Dispense:  30 tablet    Refill:  2    I, Dr. Roma Schanz, DO, personally preformed the services described in this documentation.  All medical record entries made by the scribe were at my direction and in my presence.  I have reviewed the chart and discharge instructions (if applicable) and agree that the record reflects my personal performance and is accurate and complete. 02/16/2021   I,Shehryar Baig,acting as a scribe for Ann Held, DO.,have documented all relevant documentation on the behalf of Ann Held, DO,as directed by  Ann Held, DO while in the  presence of Ann Held, DO.   Ann Held, DO

## 2021-02-17 LAB — LIPID PANEL
Cholesterol: 133 mg/dL (ref 0–200)
HDL: 43.1 mg/dL (ref >39.00)
LDL Cholesterol: 73 mg/dL (ref 0–99)
NonHDL: 89.79
Total CHOL/HDL Ratio: 3
Triglycerides: 86 mg/dL (ref 0.0–149.0)
VLDL: 17.2 mg/dL (ref 0.0–40.0)

## 2021-02-17 LAB — COMPREHENSIVE METABOLIC PANEL
ALT: 43 U/L (ref 0–53)
AST: 27 U/L (ref 0–37)
Albumin: 4.4 g/dL (ref 3.5–5.2)
Alkaline Phosphatase: 107 U/L (ref 39–117)
BUN: 12 mg/dL (ref 6–23)
CO2: 29 mEq/L (ref 19–32)
Calcium: 9.4 mg/dL (ref 8.4–10.5)
Chloride: 105 mEq/L (ref 96–112)
Creatinine, Ser: 1.17 mg/dL (ref 0.40–1.50)
GFR: 69.78 mL/min (ref >60.00)
Glucose, Bld: 84 mg/dL (ref 70–99)
Potassium: 4.5 mEq/L (ref 3.5–5.1)
Sodium: 140 mEq/L (ref 135–145)
Total Bilirubin: 0.5 mg/dL (ref 0.2–1.2)
Total Protein: 7.3 g/dL (ref 6.0–8.3)

## 2021-02-22 ENCOUNTER — Other Ambulatory Visit: Payer: Self-pay | Admitting: Family Medicine

## 2021-02-22 DIAGNOSIS — I10 Essential (primary) hypertension: Secondary | ICD-10-CM

## 2021-03-15 ENCOUNTER — Other Ambulatory Visit: Payer: Self-pay | Admitting: Family Medicine

## 2021-03-15 DIAGNOSIS — J069 Acute upper respiratory infection, unspecified: Secondary | ICD-10-CM

## 2021-03-26 ENCOUNTER — Other Ambulatory Visit: Payer: Self-pay | Admitting: Family Medicine

## 2021-03-26 DIAGNOSIS — E785 Hyperlipidemia, unspecified: Secondary | ICD-10-CM

## 2021-05-18 ENCOUNTER — Encounter: Payer: Self-pay | Admitting: Family Medicine

## 2021-05-18 ENCOUNTER — Ambulatory Visit (INDEPENDENT_AMBULATORY_CARE_PROVIDER_SITE_OTHER): Payer: 59 | Admitting: Family Medicine

## 2021-05-18 ENCOUNTER — Telehealth: Payer: Self-pay | Admitting: Family Medicine

## 2021-05-18 VITALS — BP 125/80 | HR 62 | Temp 98.3°F | Resp 12 | Ht 70.0 in | Wt 175.4 lb

## 2021-05-18 DIAGNOSIS — R935 Abnormal findings on diagnostic imaging of other abdominal regions, including retroperitoneum: Secondary | ICD-10-CM

## 2021-05-18 DIAGNOSIS — I10 Essential (primary) hypertension: Secondary | ICD-10-CM

## 2021-05-18 DIAGNOSIS — G8929 Other chronic pain: Secondary | ICD-10-CM

## 2021-05-18 DIAGNOSIS — E785 Hyperlipidemia, unspecified: Secondary | ICD-10-CM | POA: Diagnosis not present

## 2021-05-18 DIAGNOSIS — M25561 Pain in right knee: Secondary | ICD-10-CM | POA: Diagnosis not present

## 2021-05-18 MED ORDER — ROSUVASTATIN CALCIUM 10 MG PO TABS: 10.0000 mg | ORAL_TABLET | Freq: Every day | ORAL | 1 refills | Status: DC

## 2021-05-18 NOTE — Patient Instructions (Signed)
DASH Eating Plan °DASH stands for Dietary Approaches to Stop Hypertension. The DASH eating plan is a healthy eating plan that has been shown to: °Reduce high blood pressure (hypertension). °Reduce your risk for type 2 diabetes, heart disease, and stroke. °Help with weight loss. °What are tips for following this plan? °Reading food labels °Check food labels for the amount of salt (sodium) per serving. Choose foods with less than 5 percent of the Daily Value of sodium. Generally, foods with less than 300 milligrams (mg) of sodium per serving fit into this eating plan. °To find whole grains, look for the word "whole" as the first word in the ingredient list. °Shopping °Buy products labeled as "low-sodium" or "no salt added." °Buy fresh foods. Avoid canned foods and pre-made or frozen meals. °Cooking °Avoid adding salt when cooking. Use salt-free seasonings or herbs instead of table salt or sea salt. Check with your health care provider or pharmacist before using salt substitutes. °Do not fry foods. Cook foods using healthy methods such as baking, boiling, grilling, roasting, and broiling instead. °Cook with heart-healthy oils, such as olive, canola, avocado, soybean, or sunflower oil. °Meal planning ° °Eat a balanced diet that includes: °4 or more servings of fruits and 4 or more servings of vegetables each day. Try to fill one-half of your plate with fruits and vegetables. °6-8 servings of whole grains each day. °Less than 6 oz (170 g) of lean meat, poultry, or fish each day. A 3-oz (85-g) serving of meat is about the same size as a deck of cards. One egg equals 1 oz (28 g). °2-3 servings of low-fat dairy each day. One serving is 1 cup (237 mL). °1 serving of nuts, seeds, or beans 5 times each week. °2-3 servings of heart-healthy fats. Healthy fats called omega-3 fatty acids are found in foods such as walnuts, flaxseeds, fortified milks, and eggs. These fats are also found in cold-water fish, such as sardines, salmon,  and mackerel. °Limit how much you eat of: °Canned or prepackaged foods. °Food that is high in trans fat, such as some fried foods. °Food that is high in saturated fat, such as fatty meat. °Desserts and other sweets, sugary drinks, and other foods with added sugar. °Full-fat dairy products. °Do not salt foods before eating. °Do not eat more than 4 egg yolks a week. °Try to eat at least 2 vegetarian meals a week. °Eat more home-cooked food and less restaurant, buffet, and fast food. °Lifestyle °When eating at a restaurant, ask that your food be prepared with less salt or no salt, if possible. °If you drink alcohol: °Limit how much you use to: °0-1 drink a day for women who are not pregnant. °0-2 drinks a day for men. °Be aware of how much alcohol is in your drink. In the [place] one drink equals one 12 oz bottle of beer (355 mL), one 5 oz glass of wine (148 mL), or one 1½ oz glass of hard liquor (44 mL). °General information °Avoid eating more than 2,300 mg of salt a day. If you have hypertension, you may need to reduce your sodium intake to 1,500 mg a day. °Work with your health care provider to maintain a healthy body weight or to lose weight. Ask what an ideal weight is for you. °Get at least 30 minutes of exercise that causes your heart to beat faster (aerobic exercise) most days of the week. Activities may include walking, swimming, or biking. °Work with your health care provider or dietitian to   adjust your eating plan to your individual calorie needs. °What foods should I eat? °Fruits °All fresh, dried, or frozen fruit. Canned fruit in natural juice (without added sugar). °Vegetables °Fresh or frozen vegetables (raw, steamed, roasted, or grilled). Low-sodium or reduced-sodium tomato and vegetable juice. Low-sodium or reduced-sodium tomato sauce and tomato paste. Low-sodium or reduced-sodium canned vegetables. °Grains °Whole-grain or whole-wheat bread. Whole-grain or whole-wheat pasta. Brown rice. Oatmeal. Quinoa.  Bulgur. Whole-grain and low-sodium cereals. Pita bread. Low-fat, low-sodium crackers. Whole-wheat flour tortillas. °Meats and other proteins °Skinless chicken or turkey. Ground chicken or turkey. Pork with fat trimmed off. Fish and seafood. Egg whites. Dried beans, peas, or lentils. Unsalted nuts, nut butters, and seeds. Unsalted canned beans. Lean cuts of beef with fat trimmed off. Low-sodium, lean precooked or cured meat, such as sausages or meat loaves. °Dairy °Low-fat (1%) or fat-free (skim) milk. Reduced-fat, low-fat, or fat-free cheeses. Nonfat, low-sodium ricotta or cottage cheese. Low-fat or nonfat yogurt. Low-fat, low-sodium cheese. °Fats and oils °Soft margarine without trans fats. Vegetable oil. Reduced-fat, low-fat, or light mayonnaise and salad dressings (reduced-sodium). Canola, safflower, olive, avocado, soybean, and sunflower oils. Avocado. °Seasonings and condiments °Herbs. Spices. Seasoning mixes without salt. °Other foods °Unsalted popcorn and pretzels. Fat-free sweets. °The items listed above may not be a complete list of foods and beverages you can eat. Contact a dietitian for more information. °What foods should I avoid? °Fruits °Canned fruit in a light or heavy syrup. Fried fruit. Fruit in cream or butter sauce. °Vegetables °Creamed or fried vegetables. Vegetables in a cheese sauce. Regular canned vegetables (not low-sodium or reduced-sodium). Regular canned tomato sauce and paste (not low-sodium or reduced-sodium). Regular tomato and vegetable juice (not low-sodium or reduced-sodium). Pickles. Olives. °Grains °Baked goods made with fat, such as croissants, muffins, or some breads. Dry pasta or rice meal packs. °Meats and other proteins °Fatty cuts of meat. Ribs. Fried meat. Bacon. Bologna, salami, and other precooked or cured meats, such as sausages or meat loaves. Fat from the back of a pig (fatback). Bratwurst. Salted nuts and seeds. Canned beans with added salt. Canned or smoked fish.  Whole eggs or egg yolks. Chicken or turkey with skin. °Dairy °Whole or 2% milk, cream, and half-and-half. Whole or full-fat cream cheese. Whole-fat or sweetened yogurt. Full-fat cheese. Nondairy creamers. Whipped toppings. Processed cheese and cheese spreads. °Fats and oils °Butter. Stick margarine. Lard. Shortening. Ghee. Bacon fat. Tropical oils, such as coconut, palm kernel, or palm oil. °Seasonings and condiments °Onion salt, garlic salt, seasoned salt, table salt, and sea salt. Worcestershire sauce. Tartar sauce. Barbecue sauce. Teriyaki sauce. Soy sauce, including reduced-sodium. Steak sauce. Canned and packaged gravies. Fish sauce. Oyster sauce. Cocktail sauce. Store-bought horseradish. Ketchup. Mustard. Meat flavorings and tenderizers. Bouillon cubes. Hot sauces. Pre-made or packaged marinades. Pre-made or packaged taco seasonings. Relishes. Regular salad dressings. °Other foods °Salted popcorn and pretzels. °The items listed above may not be a complete list of foods and beverages you should avoid. Contact a dietitian for more information. °Where to find more information °National Heart, Lung, and Blood Institute: www.nhlbi.nih.gov °American Heart Association: www.heart.org °Academy of Nutrition and Dietetics: www.eatright.org °National Kidney Foundation: www.kidney.org °Summary °The DASH eating plan is a healthy eating plan that has been shown to reduce high blood pressure (hypertension). It may also reduce your risk for type 2 diabetes, heart disease, and stroke. °When on the DASH eating plan, aim to eat more fresh fruits and vegetables, whole grains, lean proteins, low-fat dairy, and heart-healthy fats. °With the DASH   eating plan, you should limit salt (sodium) intake to 2,300 mg a day. If you have hypertension, you may need to reduce your sodium intake to 1,500 mg a day. °Work with your health care provider or dietitian to adjust your eating plan to your individual calorie needs. °This information is not  intended to replace advice given to you by your health care provider. Make sure you discuss any questions you have with your health care provider. °Document Revised: 02/01/2019 Document Reviewed: 02/01/2019 °Elsevier Patient Education © 2022 [employer] Inc. ° °

## 2021-05-18 NOTE — Addendum Note (Signed)
Addended by: Roma Schanz R on: 05/18/2021 06:35 PM ? ? Modules accepted: Level of Service ? ?

## 2021-05-18 NOTE — Assessment & Plan Note (Signed)
Well controlled, no changes to meds. Encouraged heart healthy diet such as the DASH diet and exercise as tolerated.  °

## 2021-05-18 NOTE — Telephone Encounter (Signed)
Pt stopped at check out after visit 3/7 @ 543 pm!  ?Pt was advised to schedule another appointment if one has not been made for follow up. Pt was under the impression appointment was scheduled for June. Advised pt no appointment scheduled proceeded with scheduling. Provided pt date/time of next visit pt states if I don't find another DR by then. I asked the pt what was wrong, he stated you may see it on the news!  ? ? ?

## 2021-05-18 NOTE — Assessment & Plan Note (Signed)
Pt did not want to see specialist now due to high deductible  ?He will think about brace and let us know if it worsens  ?

## 2021-05-18 NOTE — Progress Notes (Signed)
? ?Established Patient Office Visit ? ?Subjective:  ?Patient ID: William Lawrence, male    DOB: 1965-09-30  Age: 56 y.o. MRN: [id number] ? ?CC:  ?Chief Complaint  ?Patient presents with  ? Follow-up  ? ? ?HPI ?William Lawrence presents for f/u bp   he also c/o a problem with R eye--- a few days ago it was red and had some crust in it --- he used an otc drop and it got better.  He also c/o R knee feeling off---  he feels like its off track.  No pain and it does not feel like it is going to give out  ? ? ?Past Medical History:  ?Diagnosis Date  ? Allergy   ? no meds   ? Hypertension 2021  ? ? ?Past Surgical History:  ?Procedure Laterality Date  ? WISDOM TOOTH EXTRACTION    ? ? ?Family History  ?Problem Relation Age of Onset  ? Diabetes Paternal Grandmother   ? Hypertension Paternal Grandmother   ? Prostate cancer Maternal Uncle   ? Hypertension Mother   ? Hypertension Father   ? Hypertension Brother   ? Colon cancer Neg Hx   ? Colon polyps Neg Hx   ? Esophageal cancer Neg Hx   ? Rectal cancer Neg Hx   ? Stomach cancer Neg Hx   ? ? ?Social History  ? ?Socioeconomic History  ? Marital status: Married  ?  Spouse name: Not on file  ? Number of children: Not on file  ? Years of education: Not on file  ? Highest education level: Not on file  ?Occupational History  ? Not on file  ?Tobacco Use  ? Smoking status: Never  ? Smokeless tobacco: Never  ?Substance and Sexual Activity  ? Alcohol use: Yes  ?  Comment: occ   ? Drug use: Never  ? Sexual activity: Yes  ?Other Topics Concern  ? Not on file  ?Social History Narrative  ? Not on file  ? ?Social Determinants of Health  ? ?Financial Resource Strain: Not on file  ?Food Insecurity: Not on file  ?Transportation Needs: Not on file  ?Physical Activity: Not on file  ?Stress: Not on file  ?Social Connections: Not on file  ?Intimate Partner Violence: Not on file  ? ? ?Outpatient Medications Prior to Visit  ?Medication Sig Dispense Refill  ? amLODipine (NORVASC) 5 MG tablet TAKE 1 TABLET (5 MG  TOTAL) BY MOUTH DAILY. 90 tablet 1  ? metoprolol succinate (TOPROL-XL) 100 MG 24 hr tablet TAKE 1 TABLET BY MOUTH DAILY. TAKE WITH OR IMMEDIATELY FOLLOWING A MEAL. 90 tablet 1  ? Multiple Vitamin (MULTIVITAMIN) tablet Take 1 tablet by mouth daily.    ? rosuvastatin (CRESTOR) 10 MG tablet TAKE 1 TABLET BY MOUTH EVERY DAY 90 tablet 1  ? fluticasone (FLONASE) 50 MCG/ACT nasal spray Place 2 sprays into both nostrils daily. 16 g 6  ? levocetirizine (XYZAL) 5 MG tablet TAKE 1 TABLET BY MOUTH EVERY DAY IN THE EVENING 90 tablet 0  ? ?No facility-administered medications prior to visit.  ? ? ?No Known Allergies ? ?ROS ?Review of Systems  ?Constitutional: Negative.  Negative for fever.  ?HENT:  Negative for congestion, ear pain, hearing loss, nosebleeds, postnasal drip, rhinorrhea, sinus pressure, sneezing and tinnitus.   ?Eyes:  Negative for photophobia, discharge, itching and visual disturbance.  ?Respiratory: Negative.  Negative for shortness of breath.   ?Cardiovascular: Negative.  Negative for chest pain, palpitations and leg swelling.  ?Gastrointestinal:  Negative  for abdominal distention, abdominal pain, anal bleeding, blood in stool, constipation and nausea.  ?Endocrine: Negative.   ?Genitourinary: Negative.  Negative for dysuria and frequency.  ?Musculoskeletal: Negative.   ?Skin: Negative.  Negative for rash.  ?Allergic/Immunologic: Negative.  Negative for environmental allergies.  ?Neurological:  Negative for dizziness, weakness, light-headedness, numbness and headaches.  ?Psychiatric/Behavioral:  Negative for agitation, confusion, decreased concentration, dysphoric mood, sleep disturbance and suicidal ideas. The patient is not nervous/anxious.   ?All other systems reviewed and are negative. ? ?  ?Objective:  ?  ?Physical Exam ?Vitals and nursing note reviewed.  ?Constitutional:   ?   Appearance: He is well-developed.  ?HENT:  ?   Head: Normocephalic and atraumatic.  ?Eyes:  ?   Pupils: Pupils are equal, round, and  reactive to light.  ?Neck:  ?   Thyroid: No thyromegaly.  ?Cardiovascular:  ?   Rate and Rhythm: Normal rate and regular rhythm.  ?   Heart sounds: No murmur heard. ?Pulmonary:  ?   Effort: Pulmonary effort is normal. No respiratory distress.  ?   Breath sounds: Normal breath sounds. No wheezing or rales.  ?Chest:  ?   Chest wall: No tenderness.  ?Musculoskeletal:     ?   General: No tenderness.  ?   Cervical back: Normal range of motion and neck supple.  ?Skin: ?   General: Skin is warm and dry.  ?Neurological:  ?   Mental Status: He is alert and oriented to person, place, and time.  ?Psychiatric:     ?   Behavior: Behavior normal.     ?   Thought Content: Thought content normal.     ?   Judgment: Judgment normal.  ? ? ?BP 125/80 (BP Location: Left Arm, Cuff Size: Large)   Pulse 62   Temp 98.3 ?F (36.8 ?C) (Oral)   Resp 12   Ht '5\' 10"'$  (1.778 m)   Wt 175 lb 6.4 oz (79.6 kg)   SpO2 100%   BMI 25.17 kg/m?  ?Wt Readings from Last 3 Encounters:  ?05/18/21 175 lb 6.4 oz (79.6 kg)  ?02/16/21 186 lb 6.4 oz (84.6 kg)  ?08/13/20 188 lb 9.6 oz (85.5 kg)  ? ? ? ?Health Maintenance Due  ?Topic Date Due  ? Zoster Vaccines- Shingrix (1 of 2) Never done  ? ? ?There are no preventive care reminders to display for this patient. ? ?Lab Results  ?Component Value Date  ? TSH 1.19 08/13/2020  ? ?Lab Results  ?Component Value Date  ? WBC 4.6 08/13/2020  ? HGB 13.6 08/13/2020  ? HCT 39.8 08/13/2020  ? MCV 95.2 08/13/2020  ? PLT 178.0 08/13/2020  ? ?Lab Results  ?Component Value Date  ? NA 140 02/16/2021  ? K 4.5 02/16/2021  ? CO2 29 02/16/2021  ? GLUCOSE 84 02/16/2021  ? BUN 12 02/16/2021  ? CREATININE 1.17 02/16/2021  ? BILITOT 0.5 02/16/2021  ? ALKPHOS 107 02/16/2021  ? AST 27 02/16/2021  ? ALT 43 02/16/2021  ? PROT 7.3 02/16/2021  ? ALBUMIN 4.4 02/16/2021  ? CALCIUM 9.4 02/16/2021  ? GFR 69.78 02/16/2021  ? ?Lab Results  ?Component Value Date  ? CHOL 133 02/16/2021  ? ?Lab Results  ?Component Value Date  ? HDL 43.10 02/16/2021   ? ?Lab Results  ?Component Value Date  ? [place] 73 02/16/2021  ? ?Lab Results  ?Component Value Date  ? TRIG 86.0 02/16/2021  ? ?Lab Results  ?Component Value Date  ? CHOLHDL 3  02/16/2021  ? ?No results found for: HGBA1C ? ?  ?Assessment & Plan:  ? ?Problem List Items Addressed This Visit   ? ?  ? Unprioritized  ? Chronic pain of right knee  ?  Pt did not want to see specialist now due to high deductible  ?He will think about brace and let us know if it worsens  ?  ?  ? Essential hypertension  ?  Well controlled, no changes to meds. Encouraged heart healthy diet such as the DASH diet and exercise as tolerated.  ?  ?  ? Relevant Medications  ? rosuvastatin (CRESTOR) 10 MG tablet  ? Hyperlipidemia  ?  Tolerating statin, encouraged heart healthy diet, avoid trans fats, minimize simple carbs and saturated fats. Increase exercise as tolerated ?Will recheck labs in June  ?  ?  ? Relevant Medications  ? rosuvastatin (CRESTOR) 10 MG tablet  ? ?Other Visit Diagnoses   ? ? Primary hypertension    -  Primary  ? Relevant Medications  ? rosuvastatin (CRESTOR) 10 MG tablet  ? Abnormal [place] (ultrasound) of abdomen      ? Relevant Orders  ? US Abdomen Complete  ? ?  ? ? ?Meds ordered this encounter  ?Medications  ? rosuvastatin (CRESTOR) 10 MG tablet  ?  Sig: Take 1 tablet (10 mg total) by mouth daily.  ?  Dispense:  90 tablet  ?  Refill:  1  ? ? ?Follow-up: Return if symptoms worsen or fail to improve, for as scheduled.  ? ? ?Ann Held, DO ?

## 2021-05-18 NOTE — Assessment & Plan Note (Signed)
Tolerating statin, encouraged heart healthy diet, avoid trans fats, minimize simple carbs and saturated fats. Increase exercise as tolerated ?Will recheck labs in June  ?

## 2021-05-28 ENCOUNTER — Ambulatory Visit (INDEPENDENT_AMBULATORY_CARE_PROVIDER_SITE_OTHER): Payer: 59

## 2021-05-28 ENCOUNTER — Other Ambulatory Visit: Payer: Self-pay

## 2021-05-28 DIAGNOSIS — R935 Abnormal findings on diagnostic imaging of other abdominal regions, including retroperitoneum: Secondary | ICD-10-CM

## 2021-05-28 DIAGNOSIS — R160 Hepatomegaly, not elsewhere classified: Secondary | ICD-10-CM | POA: Diagnosis not present

## 2021-05-28 DIAGNOSIS — D1803 Hemangioma of intra-abdominal structures: Secondary | ICD-10-CM

## 2021-08-04 ENCOUNTER — Other Ambulatory Visit: Payer: Self-pay | Admitting: Family Medicine

## 2021-08-04 DIAGNOSIS — J069 Acute upper respiratory infection, unspecified: Secondary | ICD-10-CM

## 2021-08-10 ENCOUNTER — Other Ambulatory Visit: Payer: Self-pay

## 2021-08-10 ENCOUNTER — Telehealth: Payer: Self-pay | Admitting: Family Medicine

## 2021-08-10 ENCOUNTER — Encounter (HOSPITAL_BASED_OUTPATIENT_CLINIC_OR_DEPARTMENT_OTHER): Payer: Self-pay | Admitting: Emergency Medicine

## 2021-08-10 ENCOUNTER — Emergency Department (HOSPITAL_BASED_OUTPATIENT_CLINIC_OR_DEPARTMENT_OTHER)
Admission: EM | Admit: 2021-08-10 | Discharge: 2021-08-10 | Disposition: A | Discharge status: Home / Self Care | Payer: 59 | Attending: Emergency Medicine | Admitting: Emergency Medicine

## 2021-08-10 ENCOUNTER — Emergency Department (HOSPITAL_BASED_OUTPATIENT_CLINIC_OR_DEPARTMENT_OTHER): Payer: 59

## 2021-08-10 DIAGNOSIS — Y92410 Unspecified street and highway as the place of occurrence of the external cause: Secondary | ICD-10-CM | POA: Diagnosis not present

## 2021-08-10 DIAGNOSIS — M4802 Spinal stenosis, cervical region: Secondary | ICD-10-CM | POA: Diagnosis not present

## 2021-08-10 DIAGNOSIS — Z79899 Other long term (current) drug therapy: Secondary | ICD-10-CM | POA: Insufficient documentation

## 2021-08-10 DIAGNOSIS — S161XXA Strain of muscle, fascia and tendon at neck level, initial encounter: Secondary | ICD-10-CM | POA: Insufficient documentation

## 2021-08-10 DIAGNOSIS — S199XXA Unspecified injury of neck, initial encounter: Secondary | ICD-10-CM | POA: Diagnosis present

## 2021-08-10 DIAGNOSIS — M542 Cervicalgia: Secondary | ICD-10-CM | POA: Diagnosis not present

## 2021-08-10 MED ORDER — METHOCARBAMOL 500 MG PO TABS: 500.0000 mg | ORAL_TABLET | Freq: Two times a day (BID) | ORAL | 0 refills | Status: DC

## 2021-08-10 MED ORDER — NAPROXEN 375 MG PO TABS: 375.0000 mg | ORAL_TABLET | Freq: Two times a day (BID) | ORAL | 0 refills | Status: DC
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
## 2021-08-10 NOTE — Telephone Encounter (Signed)
Awaiting triage message

## 2021-08-10 NOTE — Discharge Instructions (Signed)
Your CT scan did not show anything concerning.  You likely have a cervical strain.  I have sent naproxen and Robaxin into the pharmacy for you.  Robaxin is a muscle relaxer and can make you drowsy so do not drive after taking it.  You have any concerning symptoms return to the emergency room otherwise follow-up with your primary care provider.

## 2021-08-10 NOTE — Telephone Encounter (Signed)
Patient will go to ED after all per triage note.
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
## 2021-08-11 DIAGNOSIS — H524 Presbyopia: Secondary | ICD-10-CM | POA: Diagnosis not present

## 2021-08-11 DIAGNOSIS — Z0100 Encounter for examination of eyes and vision without abnormal findings: Secondary | ICD-10-CM | POA: Diagnosis not present

## 2021-08-12 ENCOUNTER — Ambulatory Visit: Payer: 59 | Admitting: Family Medicine

## 2021-08-21 ENCOUNTER — Other Ambulatory Visit: Payer: Self-pay | Admitting: Family Medicine

## 2021-08-21 DIAGNOSIS — I10 Essential (primary) hypertension: Secondary | ICD-10-CM

## 2021-08-24 ENCOUNTER — Encounter: Payer: Self-pay | Admitting: Family Medicine

## 2021-08-24 ENCOUNTER — Ambulatory Visit (INDEPENDENT_AMBULATORY_CARE_PROVIDER_SITE_OTHER): Payer: 59 | Admitting: Family Medicine

## 2021-08-24 VITALS — BP 107/72 | HR 71 | Temp 98.9°F | Resp 12 | Ht 70.0 in | Wt 179.2 lb

## 2021-08-24 DIAGNOSIS — R209 Unspecified disturbances of skin sensation: Secondary | ICD-10-CM | POA: Diagnosis not present

## 2021-08-24 DIAGNOSIS — Z0000 Encounter for general adult medical examination without abnormal findings: Secondary | ICD-10-CM | POA: Diagnosis not present

## 2021-08-24 DIAGNOSIS — E785 Hyperlipidemia, unspecified: Secondary | ICD-10-CM | POA: Diagnosis not present

## 2021-08-24 DIAGNOSIS — I10 Essential (primary) hypertension: Secondary | ICD-10-CM | POA: Diagnosis not present

## 2021-08-24 NOTE — Assessment & Plan Note (Signed)
ghm utd Check labs  See avs  

## 2021-08-24 NOTE — Assessment & Plan Note (Signed)
Well controlled, no changes to meds. Encouraged heart healthy diet such as the DASH diet and exercise as tolerated.  °

## 2021-08-24 NOTE — Assessment & Plan Note (Signed)
Encourage heart healthy diet such as MIND or DASH diet, increase exercise, avoid trans fats, simple carbohydrates and processed foods, consider a krill or fish or flaxseed oil cap daily.  °
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
## 2021-08-24 NOTE — Patient Instructions (Signed)

## 2021-08-25 LAB — CBC WITH DIFFERENTIAL/PLATELET
Basophils Absolute: 0 10*3/uL (ref 0.0–0.1)
Basophils Relative: 0.5 % (ref 0.0–3.0)
Eosinophils Absolute: 0.1 10*3/uL (ref 0.0–0.7)
Eosinophils Relative: 3.1 % (ref 0.0–5.0)
HCT: 38.7 % — ABNORMAL LOW (ref 39.0–52.0)
Hemoglobin: 13 g/dL (ref 13.0–17.0)
Lymphocytes Relative: 39.9 % (ref 12.0–46.0)
Lymphs Abs: 1.8 10*3/uL (ref 0.7–4.0)
MCHC: 33.5 g/dL (ref 30.0–36.0)
MCV: 96.3 fl (ref 78.0–100.0)
Monocytes Absolute: 0.6 10*3/uL (ref 0.1–1.0)
Monocytes Relative: 13.5 % — ABNORMAL HIGH (ref 3.0–12.0)
Neutro Abs: 1.9 10*3/uL (ref 1.4–7.7)
Neutrophils Relative %: 43 % (ref 43.0–77.0)
Platelets: 172 10*3/uL (ref 150.0–400.0)
RBC: 4.02 Mil/uL — ABNORMAL LOW (ref 4.22–5.81)
RDW: 13.3 % (ref 11.5–15.5)
WBC: 4.5 10*3/uL (ref 4.0–10.5)

## 2021-08-25 LAB — TSH: TSH: 1.67 u[IU]/mL (ref 0.35–5.50)

## 2021-08-25 LAB — PSA: PSA: 2.86 ng/mL (ref 0.10–4.00)

## 2021-08-25 LAB — COMPREHENSIVE METABOLIC PANEL
ALT: 30 U/L (ref 0–53)
AST: 21 U/L (ref 0–37)
Albumin: 4.4 g/dL (ref 3.5–5.2)
Alkaline Phosphatase: 88 U/L (ref 39–117)
BUN: 16 mg/dL (ref 6–23)
CO2: 28 mEq/L (ref 19–32)
Calcium: 9.3 mg/dL (ref 8.4–10.5)
Chloride: 105 mEq/L (ref 96–112)
Creatinine, Ser: 1.15 mg/dL (ref 0.40–1.50)
GFR: 70.98 mL/min (ref >60.00)
Glucose, Bld: 89 mg/dL (ref 70–99)
Potassium: 4 mEq/L (ref 3.5–5.1)
Sodium: 140 mEq/L (ref 135–145)
Total Bilirubin: 0.6 mg/dL (ref 0.2–1.2)
Total Protein: 7.3 g/dL (ref 6.0–8.3)

## 2021-08-25 LAB — MICROALBUMIN / CREATININE URINE RATIO
Creatinine,U: 183.6 mg/dL
Microalb Creat Ratio: 0.6 mg/g (ref 0.0–30.0)
Microalb, Ur: 1 mg/dL (ref 0.0–1.9)

## 2021-08-25 LAB — LIPID PANEL
Cholesterol: 146 mg/dL (ref 0–200)
HDL: 46.4 mg/dL (ref >39.00)
LDL Cholesterol: 76 mg/dL (ref 0–99)
NonHDL: 99.61
Total CHOL/HDL Ratio: 3
Triglycerides: 119 mg/dL (ref 0.0–149.0)
VLDL: 23.8 mg/dL (ref 0.0–40.0)

## 2021-08-25 LAB — VITAMIN D 25 HYDROXY (VIT D DEFICIENCY, FRACTURES): VITD: 39.74 ng/mL (ref 30.00–100.00)

## 2021-08-25 LAB — VITAMIN B12: Vitamin B-12: 936 pg/mL — ABNORMAL HIGH (ref 211–911)

## 2021-11-10 ENCOUNTER — Other Ambulatory Visit: Payer: Self-pay | Admitting: Family Medicine

## 2021-11-10 DIAGNOSIS — I10 Essential (primary) hypertension: Secondary | ICD-10-CM

## 2022-01-20 ENCOUNTER — Other Ambulatory Visit: Payer: Self-pay | Admitting: Family Medicine

## 2022-01-20 DIAGNOSIS — E785 Hyperlipidemia, unspecified: Secondary | ICD-10-CM

## 2022-02-24 ENCOUNTER — Ambulatory Visit: Payer: No Typology Code available for payment source | Admitting: Family Medicine

## 2022-02-28 ENCOUNTER — Telehealth (INDEPENDENT_AMBULATORY_CARE_PROVIDER_SITE_OTHER): Payer: Self-pay | Admitting: Family Medicine

## 2022-02-28 DIAGNOSIS — Z91199 Patient's noncompliance with other medical treatment and regimen due to unspecified reason: Secondary | ICD-10-CM

## 2022-02-28 NOTE — Progress Notes (Signed)
No show

## 2022-05-03 ENCOUNTER — Ambulatory Visit (INDEPENDENT_AMBULATORY_CARE_PROVIDER_SITE_OTHER): Payer: No Typology Code available for payment source | Admitting: Family Medicine

## 2022-05-03 VITALS — BP 138/98 | HR 61 | Temp 97.7°F | Resp 12 | Ht 70.0 in | Wt 185.8 lb

## 2022-05-03 DIAGNOSIS — J069 Acute upper respiratory infection, unspecified: Secondary | ICD-10-CM

## 2022-05-03 DIAGNOSIS — E785 Hyperlipidemia, unspecified: Secondary | ICD-10-CM | POA: Diagnosis not present

## 2022-05-03 DIAGNOSIS — I10 Essential (primary) hypertension: Secondary | ICD-10-CM

## 2022-05-03 MED ORDER — AMLODIPINE BESYLATE 5 MG PO TABS: 5.0000 mg | ORAL_TABLET | Freq: Every day | ORAL | 1 refills | Status: DC

## 2022-05-03 MED ORDER — ROSUVASTATIN CALCIUM 10 MG PO TABS: 10.0000 mg | ORAL_TABLET | Freq: Every day | ORAL | 1 refills | Status: DC

## 2022-05-03 MED ORDER — METOPROLOL SUCCINATE ER 100 MG PO TB24: 100.0000 mg | ORAL_TABLET | Freq: Every day | ORAL | 1 refills | Status: DC

## 2022-05-03 MED ORDER — LEVOCETIRIZINE DIHYDROCHLORIDE 5 MG PO TABS: 5.0000 mg | ORAL_TABLET | Freq: Every evening | ORAL | 2 refills | Status: DC

## 2022-05-03 NOTE — Patient Instructions (Signed)

## 2022-05-04 ENCOUNTER — Encounter: Payer: Self-pay | Admitting: Family Medicine

## 2022-05-04 ENCOUNTER — Other Ambulatory Visit (INDEPENDENT_AMBULATORY_CARE_PROVIDER_SITE_OTHER): Payer: No Typology Code available for payment source

## 2022-05-04 DIAGNOSIS — E785 Hyperlipidemia, unspecified: Secondary | ICD-10-CM | POA: Diagnosis not present

## 2022-05-04 DIAGNOSIS — I10 Essential (primary) hypertension: Secondary | ICD-10-CM

## 2022-05-04 LAB — COMPREHENSIVE METABOLIC PANEL
ALT: 46 U/L (ref 0–53)
AST: 26 U/L (ref 0–37)
Albumin: 4.5 g/dL (ref 3.5–5.2)
Alkaline Phosphatase: 96 U/L (ref 39–117)
BUN: 20 mg/dL (ref 6–23)
CO2: 24 mEq/L (ref 19–32)
Calcium: 9.7 mg/dL (ref 8.4–10.5)
Chloride: 104 mEq/L (ref 96–112)
Creatinine, Ser: 1.32 mg/dL (ref 0.40–1.50)
GFR: 59.86 mL/min — ABNORMAL LOW (ref >60.00)
Glucose, Bld: 108 mg/dL — ABNORMAL HIGH (ref 70–99)
Potassium: 4.4 mEq/L (ref 3.5–5.1)
Sodium: 139 mEq/L (ref 135–145)
Total Bilirubin: 0.7 mg/dL (ref 0.2–1.2)
Total Protein: 7.6 g/dL (ref 6.0–8.3)

## 2022-05-04 LAB — LIPID PANEL
Cholesterol: 236 mg/dL — ABNORMAL HIGH (ref 0–200)
HDL: 51.9 mg/dL (ref >39.00)
LDL Cholesterol: 162 mg/dL — ABNORMAL HIGH (ref 0–99)
NonHDL: 183.95
Total CHOL/HDL Ratio: 5
Triglycerides: 109 mg/dL (ref 0.0–149.0)
VLDL: 21.8 mg/dL (ref 0.0–40.0)

## 2022-05-04 NOTE — Assessment & Plan Note (Signed)
Flonase and antihistamine  Tylenol as needed  Return to office or call office if needed

## 2022-05-04 NOTE — Assessment & Plan Note (Signed)
Encourage heart healthy diet such as MIND or DASH diet, increase exercise, avoid trans fats, simple carbohydrates and processed foods, consider a krill or fish or flaxseed oil cap daily.   Restart statin ----  return to office for labs  Return to office 6 months

## 2022-05-04 NOTE — Assessment & Plan Note (Signed)
Poorly controlled will alter medications, encouraged DASH diet, minimize caffeine and obtain adequate sleep. Report concerning symptoms and follow up as directed and as needed  Restart meds  Pt will return to office to have labs done

## 2022-05-04 NOTE — Progress Notes (Signed)
Established Patient Office Visit  Subjective   Patient ID: William Lawrence, male    DOB: 01-31-1966  Age: 57 y.o. MRN: [id number]  Chief Complaint  Patient presents with   follow up appointment    HPI Is here to f/u BP and cholesterol .  He ran out of all his meds a month ago.  Pt also c/o pnd and mucus in his throat.  No otc meds   no fever.   Patient Active Problem List   Diagnosis Date Noted   Chronic pain of right knee 05/18/2021   Viral upper respiratory tract infection 02/16/2021   Hyperlipidemia 10/01/2019   Essential hypertension 05/27/2019   Preventative health care 05/27/2019   Contusion of right foot 09/12/2018   Right foot pain 08/10/2018   BACK PAIN WITH RADICULOPATHY 09/18/2009   Past Medical History:  Diagnosis Date   Allergy    no meds    Hypertension 2021   Past Surgical History:  Procedure Laterality Date   WISDOM TOOTH EXTRACTION     Social History   Tobacco Use   Smoking status: Never   Smokeless tobacco: Never  Substance Use Topics   Alcohol use: Yes    Comment: occ    Drug use: Never   Social History   Socioeconomic History   Marital status: Married    Spouse name: Not on file   Number of children: Not on file   Years of education: Not on file   Highest education level: Not on file  Occupational History    Comment: self employed  Tobacco Use   Smoking status: Never   Smokeless tobacco: Never  Substance and Sexual Activity   Alcohol use: Yes    Comment: occ    Drug use: Never   Sexual activity: Yes  Other Topics Concern   Not on file  Social History Narrative   Exercising ---  no    Social Determinants of Health   Financial Resource Strain: Not on file  Food Insecurity: Not on file  Transportation Needs: Not on file  Physical Activity: Not on file  Stress: Not on file  Social Connections: Not on file  Intimate Partner Violence: Not on file   Family Status  Relation Name Status   PGM  Deceased   Mat Uncle  (Not  Specified)   Mother  Alive   Father  Deceased at age 38   Brother  Alive   Sister  Alive   Sister  Alive   Neg Hx  (Not Specified)   Family History  Problem Relation Age of Onset   Diabetes Paternal Grandmother    Hypertension Paternal Grandmother    Prostate cancer Maternal Uncle    Hypertension Mother    Hypertension Father    Hypertension Brother    Colon cancer Neg Hx    Colon polyps Neg Hx    Esophageal cancer Neg Hx    Rectal cancer Neg Hx    Stomach cancer Neg Hx    No Known Allergies    ROS    Objective:     BP (!) 138/98 (BP Location: Left Arm, Cuff Size: Large)   Pulse 61   Temp 97.7 F (36.5 C) (Oral)   Resp 12   Ht 5' 10"$  (1.778 m)   Wt 185 lb 12.8 oz (84.3 kg) Comment: with shoes  SpO2 97%   BMI 26.66 kg/m  BP Readings from Last 3 Encounters:  05/03/22 (!) 138/98  08/24/21 107/72  08/10/21 127/85  Wt Readings from Last 3 Encounters:  05/03/22 185 lb 12.8 oz (84.3 kg)  08/24/21 179 lb 3.2 oz (81.3 kg)  08/10/21 182 lb (82.6 kg)   SpO2 Readings from Last 3 Encounters:  05/03/22 97%  08/24/21 99%  08/10/21 100%      Physical Exam   No results found for any visits on 05/03/22.  Last CBC Lab Results  Component Value Date   WBC 4.5 08/24/2021   HGB 13.0 08/24/2021   HCT 38.7 (L) 08/24/2021   MCV 96.3 08/24/2021   RDW 13.3 08/24/2021   PLT 172.0 99991111   Last metabolic panel Lab Results  Component Value Date   GLUCOSE 89 08/24/2021   NA 140 08/24/2021   K 4.0 08/24/2021   CL 105 08/24/2021   CO2 28 08/24/2021   BUN 16 08/24/2021   CREATININE 1.15 08/24/2021   CALCIUM 9.3 08/24/2021   PROT 7.3 08/24/2021   ALBUMIN 4.4 08/24/2021   BILITOT 0.6 08/24/2021   ALKPHOS 88 08/24/2021   AST 21 08/24/2021   ALT 30 08/24/2021   Last lipids Lab Results  Component Value Date   CHOL 146 08/24/2021   HDL 46.40 08/24/2021   LDLCALC 76 08/24/2021   TRIG 119.0 08/24/2021   CHOLHDL 3 08/24/2021   Last hemoglobin A1c No  results found for: "HGBA1C" Last thyroid functions Lab Results  Component Value Date   TSH 1.67 08/24/2021   Last vitamin D Lab Results  Component Value Date   VD25OH 39.74 08/24/2021   Last vitamin B12 and Folate Lab Results  Component Value Date   [id number] (H) 08/24/2021      The 10-year ASCVD risk score (Arnett DK, et al., 2019) is: 22.9%    Assessment & Plan:   Problem List Items Addressed This Visit       Unprioritized   Viral upper respiratory tract infection - Primary    Flonase and antihistamine  Tylenol as needed  Return to office or call office if needed       Relevant Medications   levocetirizine (XYZAL) 5 MG tablet   Hyperlipidemia    Encourage heart healthy diet such as MIND or DASH diet, increase exercise, avoid trans fats, simple carbohydrates and processed foods, consider a krill or fish or flaxseed oil cap daily.   Restart statin ----  return to office for labs  Return to office 6 months       Relevant Medications   amLODipine (NORVASC) 5 MG tablet   metoprolol succinate (TOPROL-XL) 100 MG 24 hr tablet   rosuvastatin (CRESTOR) 10 MG tablet   Other Relevant Orders   CBC+Platelet+Hem Review   Comprehensive metabolic panel   Lipid panel   Essential hypertension    Poorly controlled will alter medications, encouraged DASH diet, minimize caffeine and obtain adequate sleep. Report concerning symptoms and follow up as directed and as needed  Restart meds  Pt will return to office to have labs done        Relevant Medications   amLODipine (NORVASC) 5 MG tablet   metoprolol succinate (TOPROL-XL) 100 MG 24 hr tablet   rosuvastatin (CRESTOR) 10 MG tablet   Other Relevant Orders   CBC+Platelet+Hem Review   Comprehensive metabolic panel   Lipid panel    Return in about 6 months (around 11/01/2022), or if symptoms worsen or fail to improve, for annual exam, fasting-----  he would like labs tomorrow .    Ann Held, DO

## 2022-05-10 LAB — CBC+PLATELET+HEM REVIEW
BASO(ABSOLUTE): 0 10*3/uL (ref 0.0–0.2)
Basophils Manual: 1 %
EOS (ABSOLUTE VALUE): 0.1 10*3/uL (ref 0.0–0.4)
Eosinophils Manual: 2 %
Hematocrit: 41.3 % (ref 37.5–51.0)
Hemoglobin: 14 g/dL (ref 13.0–17.7)
Lymphocytes Absolute: 1.8 10*3/uL (ref 0.7–3.1)
Lymphocytes Manual: 47 %
MCH: 31.3 pg (ref 26.6–33.0)
MCHC: 33.9 g/dL (ref 31.5–35.7)
MCV: 92 fL (ref 79–97)
Monocytes Manual: 11 %
Monocytes(Absolute): 0.4 10*3/uL (ref 0.1–0.9)
Neutrophils Absolute: 1.5 10*3/uL (ref 1.4–7.0)
Neutrophils Manual: 39 %
PLATELET COMMENT: ADEQUATE
Platelets: 203 10*3/uL (ref 150–450)
RBC COMMENT: NORMAL
RBC: 4.47 x10E6/uL (ref 4.14–5.80)
RDW: 12.4 % (ref 11.6–15.4)
WBC: 3.8 10*3/uL (ref 3.4–10.8)

## 2022-06-24 ENCOUNTER — Other Ambulatory Visit: Payer: Self-pay | Admitting: Family Medicine

## 2022-06-24 DIAGNOSIS — I10 Essential (primary) hypertension: Secondary | ICD-10-CM

## 2022-08-12 ENCOUNTER — Encounter: Payer: Self-pay | Admitting: Gastroenterology

## 2022-08-17 ENCOUNTER — Other Ambulatory Visit: Payer: Self-pay | Admitting: Family Medicine

## 2022-08-17 DIAGNOSIS — J069 Acute upper respiratory infection, unspecified: Secondary | ICD-10-CM

## 2022-10-27 ENCOUNTER — Encounter (INDEPENDENT_AMBULATORY_CARE_PROVIDER_SITE_OTHER): Payer: Self-pay

## 2022-11-15 ENCOUNTER — Encounter: Payer: Self-pay | Admitting: Pharmacist

## 2022-11-17 ENCOUNTER — Ambulatory Visit: Payer: No Typology Code available for payment source | Admitting: Family Medicine

## 2022-11-17 ENCOUNTER — Encounter: Payer: Self-pay | Admitting: Family Medicine

## 2022-11-17 VITALS — BP 118/84 | HR 73 | Temp 98.9°F | Resp 18 | Ht 70.0 in | Wt 185.6 lb

## 2022-11-17 DIAGNOSIS — Z125 Encounter for screening for malignant neoplasm of prostate: Secondary | ICD-10-CM

## 2022-11-17 DIAGNOSIS — E785 Hyperlipidemia, unspecified: Secondary | ICD-10-CM | POA: Diagnosis not present

## 2022-11-17 DIAGNOSIS — Z0001 Encounter for general adult medical examination with abnormal findings: Secondary | ICD-10-CM

## 2022-11-17 DIAGNOSIS — R0602 Shortness of breath: Secondary | ICD-10-CM | POA: Diagnosis not present

## 2022-11-17 DIAGNOSIS — Z0000 Encounter for general adult medical examination without abnormal findings: Secondary | ICD-10-CM

## 2022-11-17 DIAGNOSIS — I10 Essential (primary) hypertension: Secondary | ICD-10-CM | POA: Diagnosis not present

## 2022-11-17 DIAGNOSIS — Z1211 Encounter for screening for malignant neoplasm of colon: Secondary | ICD-10-CM

## 2022-11-17 LAB — CBC WITH DIFFERENTIAL/PLATELET
Basophils Absolute: 0 10*3/uL (ref 0.0–0.1)
Basophils Relative: 1.3 % (ref 0.0–3.0)
Eosinophils Absolute: 0.1 10*3/uL (ref 0.0–0.7)
Eosinophils Relative: 3.5 % (ref 0.0–5.0)
HCT: 42.6 % (ref 39.0–52.0)
Hemoglobin: 13.7 g/dL (ref 13.0–17.0)
Lymphocytes Relative: 42.5 % (ref 12.0–46.0)
Lymphs Abs: 1.6 10*3/uL (ref 0.7–4.0)
MCHC: 32.2 g/dL (ref 30.0–36.0)
MCV: 98.4 fl (ref 78.0–100.0)
Monocytes Absolute: 0.6 10*3/uL (ref 0.1–1.0)
Monocytes Relative: 15 % — ABNORMAL HIGH (ref 3.0–12.0)
Neutro Abs: 1.4 10*3/uL (ref 1.4–7.7)
Neutrophils Relative %: 37.7 % — ABNORMAL LOW (ref 43.0–77.0)
Platelets: 168 10*3/uL (ref 150.0–400.0)
RBC: 4.33 Mil/uL (ref 4.22–5.81)
RDW: 12.9 % (ref 11.5–15.5)
WBC: 3.8 10*3/uL — ABNORMAL LOW (ref 4.0–10.5)

## 2022-11-17 LAB — COMPREHENSIVE METABOLIC PANEL
ALT: 32 U/L (ref 0–53)
AST: 22 U/L (ref 0–37)
Albumin: 4.2 g/dL (ref 3.5–5.2)
Alkaline Phosphatase: 86 U/L (ref 39–117)
BUN: 17 mg/dL (ref 6–23)
CO2: 27 meq/L (ref 19–32)
Calcium: 9 mg/dL (ref 8.4–10.5)
Chloride: 105 meq/L (ref 96–112)
Creatinine, Ser: 1.2 mg/dL (ref 0.40–1.50)
GFR: 66.86 mL/min (ref >60.00)
Glucose, Bld: 84 mg/dL (ref 70–99)
Potassium: 4.5 meq/L (ref 3.5–5.1)
Sodium: 139 meq/L (ref 135–145)
Total Bilirubin: 0.7 mg/dL (ref 0.2–1.2)
Total Protein: 7.2 g/dL (ref 6.0–8.3)

## 2022-11-17 LAB — LIPID PANEL
Cholesterol: 137 mg/dL (ref 0–200)
HDL: 47.8 mg/dL (ref >39.00)
LDL Cholesterol: 74 mg/dL (ref 0–99)
NonHDL: 89.44
Total CHOL/HDL Ratio: 3
Triglycerides: 77 mg/dL (ref 0.0–149.0)
VLDL: 15.4 mg/dL (ref 0.0–40.0)

## 2022-11-17 LAB — TSH: TSH: 1.27 u[IU]/mL (ref 0.35–5.50)

## 2022-11-17 LAB — PSA: PSA: 2.33 ng/mL (ref 0.10–4.00)

## 2022-11-17 NOTE — Progress Notes (Signed)
Established Patient Office Visit  Subjective   Patient ID: William Lawrence, male    DOB: 1966-03-14  Age: 57 y.o. MRN: [id number]  Chief Complaint  Patient presents with   Annual Exam    Pt states fasting.     HPI Discussed the use of AI scribe software for clinical note transcription with the patient, who gave verbal consent to proceed.  History of Present Illness   The patient, with a history of high blood pressure and liver issues, presents for a routine physical examination. He reports occasional shortness of breath, which he has noticed at rest and not associated with exertion. The patient describes these episodes as periodic, with one episode occurring in January and another more recently. Each episode lasts for several days. He denies any chest pain or other associated symptoms.  The patient also discusses his high blood pressure, which he believes is not related to his diet or lifestyle, but possibly genetic. He reports that both parents have high blood pressure, but their siblings do not. The patient is currently taking medication for his high blood pressure.  Regarding his liver issues, the patient had an ultrasound last year due to elevated liver function tests. He believes this was related to a medication he was taking at the time. The ultrasound revealed benign findings, and a repeat ultrasound confirmed stability.  The patient also mentions taking Omega XL for joint health but finds it expensive. He is considering switching to a different supplement.      Patient Active Problem List   Diagnosis Date Noted   Chronic pain of right knee 05/18/2021   Viral upper respiratory tract infection 02/16/2021   Hyperlipidemia 10/01/2019   Essential hypertension 05/27/2019   Preventative health care 05/27/2019   Contusion of right foot 09/12/2018   Right foot pain 08/10/2018   BACK PAIN WITH RADICULOPATHY 09/18/2009   Past Medical History:  Diagnosis Date   Allergy    no meds     Hypertension 2021   Past Surgical History:  Procedure Laterality Date   WISDOM TOOTH EXTRACTION     Social History   Tobacco Use   Smoking status: Never   Smokeless tobacco: Never  Substance Use Topics   Alcohol use: Yes    Comment: occ    Drug use: Never   Social History   Socioeconomic History   Marital status: Married    Spouse name: Not on file   Number of children: Not on file   Years of education: Not on file   Highest education level: Not on file  Occupational History    Comment: self employed  Tobacco Use   Smoking status: Never   Smokeless tobacco: Never  Substance and Sexual Activity   Alcohol use: Yes    Comment: occ    Drug use: Never   Sexual activity: Yes  Other Topics Concern   Not on file  Social History Narrative   Exercising ---  no    Social Determinants of Health   Financial Resource Strain: Not on file  Food Insecurity: Not on file  Transportation Needs: Not on file  Physical Activity: Not on file  Stress: Not on file  Social Connections: Not on file  Intimate Partner Violence: Not on file   Family Status  Relation Name Status   Mother  Alive   Father  Deceased at age 60   Sister  Alive   Sister  Alive   Brother  Alive   PGM  Deceased  Mat Uncle  (Not Specified)   Neg Hx  (Not Specified)  No partnership data on file   Family History  Problem Relation Age of Onset   Hypertension Mother    Hypertension Father    Diabetes Paternal Grandmother    Hypertension Paternal Grandmother    Prostate cancer Maternal Uncle    Colon cancer Neg Hx    Colon polyps Neg Hx    Esophageal cancer Neg Hx    Rectal cancer Neg Hx    Stomach cancer Neg Hx    No Known Allergies    Review of Systems  Constitutional:  Negative for chills, fever and malaise/fatigue.  HENT:  Negative for congestion and hearing loss.   Eyes:  Negative for blurred vision and discharge.  Respiratory:  Negative for cough, sputum production and shortness of breath.    Cardiovascular:  Negative for chest pain, palpitations and leg swelling.  Gastrointestinal:  Negative for abdominal pain, blood in stool, constipation, diarrhea, heartburn, nausea and vomiting.  Genitourinary:  Negative for dysuria, frequency, hematuria and urgency.  Musculoskeletal:  Negative for back pain, falls and myalgias.  Skin:  Negative for rash.  Neurological:  Negative for dizziness, sensory change, loss of consciousness, weakness and headaches.  Endo/Heme/Allergies:  Negative for environmental allergies. Does not bruise/bleed easily.  Psychiatric/Behavioral:  Negative for depression and suicidal ideas. The patient is not nervous/anxious and does not have insomnia.       Objective:     BP 118/84 (BP Location: Right Arm, Patient Position: Sitting, Cuff Size: Large)   Pulse 73   Temp 98.9 F (37.2 C) (Oral)   Resp 18   Ht 5\' 10"  (1.778 m)   Wt 185 lb 9.6 oz (84.2 kg)   SpO2 99%   BMI 26.63 kg/m  BP Readings from Last 3 Encounters:  11/17/22 118/84  05/03/22 (!) 138/98  08/24/21 107/72   Wt Readings from Last 3 Encounters:  11/17/22 185 lb 9.6 oz (84.2 kg)  05/03/22 185 lb 12.8 oz (84.3 kg)  08/24/21 179 lb 3.2 oz (81.3 kg)   SpO2 Readings from Last 3 Encounters:  11/17/22 99%  05/03/22 97%  08/24/21 99%      Physical Exam Vitals and nursing note reviewed.  Constitutional:      General: He is not in acute distress.    Appearance: Normal appearance. He is well-developed.  HENT:     Head: Normocephalic and atraumatic.     Right Ear: Tympanic membrane, ear canal and external ear normal. There is no impacted cerumen.     Left Ear: Tympanic membrane, ear canal and external ear normal. There is no impacted cerumen.     Nose: Nose normal.     Mouth/Throat:     Mouth: Mucous membranes are moist.     Pharynx: Oropharynx is clear. No oropharyngeal exudate or posterior oropharyngeal erythema.  Eyes:     General: No scleral icterus.       Right eye: No discharge.         Left eye: No discharge.     Conjunctiva/sclera: Conjunctivae normal.     Pupils: Pupils are equal, round, and reactive to light.  Neck:     Thyroid: No thyromegaly.     Vascular: No JVD.  Cardiovascular:     Rate and Rhythm: Normal rate and regular rhythm.     Heart sounds: Normal heart sounds. No murmur heard. Pulmonary:     Effort: Pulmonary effort is normal. No respiratory distress.  Breath sounds: Normal breath sounds.  Abdominal:     General: Bowel sounds are normal. There is no distension.     Palpations: Abdomen is soft. There is no mass.     Tenderness: There is no abdominal tenderness. There is no guarding or rebound.  Musculoskeletal:        General: Normal range of motion.     Cervical back: Normal range of motion and neck supple.     Right lower leg: No edema.     Left lower leg: No edema.  Lymphadenopathy:     Cervical: No cervical adenopathy.  Skin:    General: Skin is warm and dry.     Findings: No erythema or rash.  Neurological:     Mental Status: He is alert and oriented to person, place, and time.     Cranial Nerves: No cranial nerve deficit.     Motor: No abnormal muscle tone.     Deep Tendon Reflexes: Reflexes are normal and symmetric. Reflexes normal.  Psychiatric:        Mood and Affect: Mood normal.        Behavior: Behavior normal.        Thought Content: Thought content normal.        Judgment: Judgment normal.      No results found for any visits on 11/17/22.  Last CBC Lab Results  Component Value Date   WBC 3.8 05/04/2022   HGB 14.0 05/04/2022   HCT 41.3 05/04/2022   MCV 92 05/04/2022   MCH 31.3 05/04/2022   RDW 12.4 05/04/2022   PLT 203 05/04/2022   Last metabolic panel Lab Results  Component Value Date   GLUCOSE 108 (H) 05/04/2022   NA 139 05/04/2022   K 4.4 05/04/2022   CL 104 05/04/2022   CO2 24 05/04/2022   BUN 20 05/04/2022   CREATININE 1.32 05/04/2022   GFR 59.86 (L) 05/04/2022   CALCIUM 9.7 05/04/2022    PROT 7.6 05/04/2022   ALBUMIN 4.5 05/04/2022   BILITOT 0.7 05/04/2022   ALKPHOS 96 05/04/2022   AST 26 05/04/2022   ALT 46 05/04/2022   Last lipids Lab Results  Component Value Date   CHOL 236 (H) 05/04/2022   HDL 51.90 05/04/2022   LDLCALC 162 (H) 05/04/2022   TRIG 109.0 05/04/2022   CHOLHDL 5 05/04/2022   Last hemoglobin A1c No results found for: "HGBA1C" Last thyroid functions Lab Results  Component Value Date   TSH 1.67 08/24/2021   Last vitamin D Lab Results  Component Value Date   VD25OH 39.74 08/24/2021   Last vitamin B12 and Folate Lab Results  Component Value Date   VITAMINB12 936 (H) 08/24/2021      The 10-year ASCVD risk score (Arnett DK, et al., 2019) is: 11.1%    Assessment & Plan:   Problem List Items Addressed This Visit       Unprioritized   Preventative health care - Primary    Ghm utd Check labs  See AVS  Health Maintenance  Topic Date Due   HIV Screening  Never done   DTaP/Tdap/Td (1 - Tdap) Never done   Colonoscopy  08/29/2022   COVID-19 Vaccine (3 - 2023-24 season) 12/03/2022 (Originally 11/13/2022)   Zoster Vaccines- Shingrix (1 of 2) 02/16/2023 (Originally 10/21/2014)   INFLUENZA VACCINE  06/12/2023 (Originally 10/13/2022)   Hepatitis C Screening  Completed   HPV VACCINES  Aged Out         Relevant Orders  Lipid panel   PSA   TSH   Comprehensive metabolic panel   CBC with Differential/Platelet   Hyperlipidemia    Tolerating statin, encouraged heart healthy diet, avoid trans fats, minimize simple carbs and saturated fats. Increase exercise as tolerated       Relevant Orders   Comprehensive metabolic panel   CT CARDIAC SCORING   Essential hypertension    Well controlled, no changes to meds. Encouraged heart healthy diet such as the DASH diet and exercise as tolerated.        Relevant Orders   EKG 12-Lead (Completed)   CBC with Differential/Platelet   Other Visit Diagnoses     Colon cancer screening       Relevant  Orders   Ambulatory referral to Gastroenterology   SOB (shortness of breath)       Relevant Orders   CT CARDIAC SCORING     Assessment and Plan    Shortness of Breath Patient reports intermittent episodes of shortness of breath, more noticeable at rest. EKG performed in office was normal. -Order CT scan for calcium scoring to assess for coronary artery disease. -Order labs today for further evaluation.  Hyperlipidemia Patient is currently taking Omega XL for cholesterol management. -Advise patient to check the amount of fish oil in Omega XL and supplement with additional fish oil if necessary to reach a total of 4000 units daily.  Colon Cancer Screening Patient due for colonoscopy this year but has not yet scheduled it. -Advise patient to schedule colonoscopy as soon as possible.  Immunizations Patient due for tetanus vaccine but declined it today. -Advise patient to receive tetanus vaccine at next visit.  General Health Maintenance -Annual physical exam performed today. -Advise patient to continue current lifestyle habits including no smoking and occasional alcohol use. -Encourage patient to increase physical activity.        No follow-ups on file.    Donato Schultz, DO

## 2022-11-17 NOTE — Assessment & Plan Note (Signed)
Tolerating statin, encouraged heart healthy diet, avoid trans fats, minimize simple carbs and saturated fats. Increase exercise as tolerated 

## 2022-11-17 NOTE — Assessment & Plan Note (Addendum)
Well controlled, no changes to meds. Encouraged heart healthy diet such as the DASH diet and exercise as tolerated.  °

## 2022-11-17 NOTE — Assessment & Plan Note (Signed)
Ghm utd Check labs  See AVS  Health Maintenance  Topic Date Due   HIV Screening  Never done   DTaP/Tdap/Td (1 - Tdap) Never done   Colonoscopy  08/29/2022   COVID-19 Vaccine (3 - 2023-24 season) 12/03/2022 (Originally 11/13/2022)   Zoster Vaccines- Shingrix (1 of 2) 02/16/2023 (Originally 10/21/2014)   INFLUENZA VACCINE  06/12/2023 (Originally 10/13/2022)   Hepatitis C Screening  Completed   HPV VACCINES  Aged Out

## 2022-11-18 NOTE — Progress Notes (Signed)
Letter mailed out. Done 

## 2022-11-21 ENCOUNTER — Ambulatory Visit (HOSPITAL_BASED_OUTPATIENT_CLINIC_OR_DEPARTMENT_OTHER)
Admission: RE | Admit: 2022-11-21 | Discharge: 2022-11-21 | Disposition: A | Discharge status: Home / Self Care | Payer: No Typology Code available for payment source | Source: Ambulatory Visit | Attending: Family Medicine | Admitting: Family Medicine

## 2022-11-21 DIAGNOSIS — R0602 Shortness of breath: Secondary | ICD-10-CM | POA: Insufficient documentation

## 2022-11-21 DIAGNOSIS — E785 Hyperlipidemia, unspecified: Secondary | ICD-10-CM | POA: Insufficient documentation

## 2022-11-23 ENCOUNTER — Other Ambulatory Visit: Payer: Self-pay

## 2022-11-23 DIAGNOSIS — E785 Hyperlipidemia, unspecified: Secondary | ICD-10-CM

## 2022-11-23 MED ORDER — ROSUVASTATIN CALCIUM 10 MG PO TABS: 10.0000 mg | ORAL_TABLET | Freq: Every day | ORAL | 1 refills | Status: DC

## 2022-12-19 ENCOUNTER — Encounter: Payer: Self-pay | Admitting: Family Medicine

## 2022-12-22 ENCOUNTER — Other Ambulatory Visit: Payer: Self-pay | Admitting: Family Medicine

## 2022-12-22 DIAGNOSIS — I10 Essential (primary) hypertension: Secondary | ICD-10-CM

## 2023-04-01 ENCOUNTER — Other Ambulatory Visit: Payer: Self-pay | Admitting: Family Medicine

## 2023-04-01 DIAGNOSIS — I10 Essential (primary) hypertension: Secondary | ICD-10-CM

## 2023-05-18 ENCOUNTER — Encounter: Payer: Self-pay | Admitting: Family Medicine

## 2023-05-18 ENCOUNTER — Ambulatory Visit: Payer: No Typology Code available for payment source | Admitting: Family Medicine

## 2023-05-18 VITALS — BP 120/88 | HR 71 | Temp 98.5°F | Resp 16 | Ht 70.0 in | Wt 188.4 lb

## 2023-05-18 DIAGNOSIS — I10 Essential (primary) hypertension: Secondary | ICD-10-CM | POA: Diagnosis not present

## 2023-05-18 DIAGNOSIS — E785 Hyperlipidemia, unspecified: Secondary | ICD-10-CM

## 2023-05-18 LAB — LIPID PANEL
Cholesterol: 149 mg/dL (ref 0–200)
HDL: 46.1 mg/dL (ref >39.00)
LDL Cholesterol: 88 mg/dL (ref 0–99)
NonHDL: 103.32
Total CHOL/HDL Ratio: 3
Triglycerides: 77 mg/dL (ref 0.0–149.0)
VLDL: 15.4 mg/dL (ref 0.0–40.0)

## 2023-05-18 LAB — POC URINALSYSI DIPSTICK (AUTOMATED)
Bilirubin, UA: NEGATIVE
Blood, UA: NEGATIVE
Glucose, UA: NEGATIVE
Ketones, UA: NEGATIVE
Leukocytes, UA: NEGATIVE
Nitrite, UA: NEGATIVE
Protein, UA: NEGATIVE
Spec Grav, UA: <=1.005 — AB (ref 1.010–1.025)
Urobilinogen, UA: 0.2 U/dL
pH, UA: 5 (ref 5.0–8.0)

## 2023-05-18 LAB — COMPREHENSIVE METABOLIC PANEL
ALT: 31 U/L (ref 0–53)
AST: 22 U/L (ref 0–37)
Albumin: 4.3 g/dL (ref 3.5–5.2)
Alkaline Phosphatase: 83 U/L (ref 39–117)
BUN: 14 mg/dL (ref 6–23)
CO2: 29 meq/L (ref 19–32)
Calcium: 9.2 mg/dL (ref 8.4–10.5)
Chloride: 105 meq/L (ref 96–112)
Creatinine, Ser: 1.17 mg/dL (ref 0.40–1.50)
GFR: 68.69 mL/min (ref >60.00)
Glucose, Bld: 96 mg/dL (ref 70–99)
Potassium: 4.6 meq/L (ref 3.5–5.1)
Sodium: 139 meq/L (ref 135–145)
Total Bilirubin: 0.6 mg/dL (ref 0.2–1.2)
Total Protein: 6.9 g/dL (ref 6.0–8.3)

## 2023-05-18 NOTE — Progress Notes (Signed)
 Established Patient Office Visit  Subjective   Patient ID: William Lawrence, male    DOB: 13-Jul-1965  Age: 58 y.o. MRN: [id number]  Chief Complaint  Patient presents with   Hypertension   Hyperlipidemia   Follow-up    HPI Discussed the use of AI scribe software for clinical note transcription with the patient, who gave verbal consent to proceed.  History of Present Illness   William Lawrence is a 58 year old male who presents for a follow-up visit.  He is currently on cholesterol medication with no reported side effects such as muscle aches, joint pains, or ankle swelling. No issues with his current medications have been reported.  Over the past two years, he has experienced weight fluctuations, losing about ten pounds when his job required more physical activity. He attributes the weight loss to increased activity and reduced food intake. Recently, he has gained back some weight, which he believes is due to decreased physical activity and possibly water retention.  His dietary habits include a preference for low-carb, high-protein meals. He avoids fast food and is cautious about sugar intake, particularly in protein shakes and oatmeal. He uses protein shakes from [employer], which he believes do not contain added sugar.  He has a history of using creatine supplements but stopped due to abnormal blood work results. He notes that creatine affected his lab results even after a single use and advises against its use based on his experience.  He is aware that he is overdue for a colonoscopy and plans to schedule it within the next month or two.      Patient Active Problem List   Diagnosis Date Noted   Chronic pain of right knee 05/18/2021   Viral upper respiratory tract infection 02/16/2021   Hyperlipidemia 10/01/2019   Essential hypertension 05/27/2019   Preventative health care 05/27/2019   Contusion of right foot 09/12/2018   Right foot pain 08/10/2018   BACK PAIN WITH RADICULOPATHY  09/18/2009   Past Medical History:  Diagnosis Date   Allergy    no meds    Hypertension 2021   Past Surgical History:  Procedure Laterality Date   WISDOM TOOTH EXTRACTION     Social History   Tobacco Use   Smoking status: Never   Smokeless tobacco: Never  Substance Use Topics   Alcohol use: Yes    Comment: occ    Drug use: Never   Social History   Socioeconomic History   Marital status: Married    Spouse name: Not on file   Number of children: Not on file   Years of education: Not on file   Highest education level: Not on file  Occupational History    Comment: self employed  Tobacco Use   Smoking status: Never   Smokeless tobacco: Never  Substance and Sexual Activity   Alcohol use: Yes    Comment: occ    Drug use: Never   Sexual activity: Yes  Other Topics Concern   Not on file  Social History Narrative   Exercising ---  no    Social Drivers of Corporate investment banker Strain: Not on file  Food Insecurity: Not on file  Transportation Needs: Not on file  Physical Activity: Not on file  Stress: Not on file  Social Connections: Not on file  Intimate Partner Violence: Not on file   Family Status  Relation Name Status   Mother  Alive   Father  Deceased at age 7   Sister  Alive   Sister  Alive   Brother  Alive   PGM  Deceased   Mat Uncle  (Not Specified)   Neg Hx  (Not Specified)  No partnership data on file   Family History  Problem Relation Age of Onset   Hypertension Mother    Hypertension Father    Diabetes Paternal Grandmother    Hypertension Paternal Grandmother    Prostate cancer Maternal Uncle    Colon cancer Neg Hx    Colon polyps Neg Hx    Esophageal cancer Neg Hx    Rectal cancer Neg Hx    Stomach cancer Neg Hx    No Known Allergies     Review of Systems  Constitutional:  Negative for chills, fever and malaise/fatigue.  HENT:  Negative for congestion and hearing loss.   Eyes:  Negative for blurred vision and discharge.   Respiratory:  Negative for cough, sputum production and shortness of breath.   Cardiovascular:  Negative for chest pain, palpitations and leg swelling.  Gastrointestinal:  Negative for abdominal pain, blood in stool, constipation, diarrhea, heartburn, nausea and vomiting.  Genitourinary:  Negative for dysuria, frequency, hematuria and urgency.  Musculoskeletal:  Negative for back pain, falls and myalgias.  Skin:  Negative for rash.  Neurological:  Negative for dizziness, sensory change, loss of consciousness, weakness and headaches.  Endo/Heme/Allergies:  Negative for environmental allergies. Does not bruise/bleed easily.  Psychiatric/Behavioral:  Negative for depression and suicidal ideas. The patient is not nervous/anxious and does not have insomnia.     +  Objective:     BP 120/88 (BP Location: Left Arm, Patient Position: Sitting, Cuff Size: Large)   Pulse 71   Temp 98.5 F (36.9 C) (Oral)   Resp 16   Ht 5\' 10"  (1.778 m)   Wt 188 lb 6.4 oz (85.5 kg)   SpO2 98%   BMI 27.03 kg/m  BP Readings from Last 3 Encounters:  05/18/23 120/88  11/17/22 118/84  05/03/22 (!) 138/98   Wt Readings from Last 3 Encounters:  05/18/23 188 lb 6.4 oz (85.5 kg)  11/17/22 185 lb 9.6 oz (84.2 kg)  05/03/22 185 lb 12.8 oz (84.3 kg)   SpO2 Readings from Last 3 Encounters:  05/18/23 98%  11/17/22 99%  05/03/22 97%      Physical Exam Vitals and nursing note reviewed.  Constitutional:      General: He is not in acute distress.    Appearance: Normal appearance. He is well-developed.  HENT:     Head: Normocephalic and atraumatic.  Eyes:     General: No scleral icterus.       Right eye: No discharge.        Left eye: No discharge.  Cardiovascular:     Rate and Rhythm: Normal rate and regular rhythm.     Heart sounds: No murmur heard. Pulmonary:     Effort: Pulmonary effort is normal. No respiratory distress.     Breath sounds: Normal breath sounds.  Musculoskeletal:        General:  Normal range of motion.     Cervical back: Normal range of motion and neck supple.     Right lower leg: No edema.     Left lower leg: No edema.  Skin:    General: Skin is warm and dry.  Neurological:     General: No focal deficit present.     Mental Status: He is alert and oriented to person, place, and time.  Psychiatric:  Mood and Affect: Mood normal.        Behavior: Behavior normal.        Thought Content: Thought content normal.        Judgment: Judgment normal.      Results for orders placed or performed in visit on 05/18/23  Comprehensive metabolic panel  Result Value Ref Range   Sodium 139 135 - 145 mEq/L   Potassium 4.6 3.5 - 5.1 mEq/L   Chloride 105 96 - 112 mEq/L   CO2 29 19 - 32 mEq/L   Glucose, Bld 96 70 - 99 mg/dL   BUN 14 6 - 23 mg/dL   Creatinine, Ser 1.61 0.40 - 1.50 mg/dL   Total Bilirubin 0.6 0.2 - 1.2 mg/dL   Alkaline Phosphatase 83 39 - 117 U/L   AST 22 0 - 37 U/L   ALT 31 0 - 53 U/L   Total Protein 6.9 6.0 - 8.3 g/dL   Albumin 4.3 3.5 - 5.2 g/dL   GFR 09.60 >45.40 mL/min   Calcium 9.2 8.4 - 10.5 mg/dL  Lipid panel  Result Value Ref Range   Cholesterol 149 0 - 200 mg/dL   Triglycerides 98.1 0.0 - 149.0 mg/dL   HDL 19.14 >78.29 mg/dL   VLDL 56.2 0.0 - 13.0 mg/dL   LDL Cholesterol 88 0 - 99 mg/dL   Total CHOL/HDL Ratio 3    NonHDL 103.32     Last CBC Lab Results  Component Value Date   WBC 3.8 (L) 11/17/2022   HGB 13.7 11/17/2022   HCT 42.6 11/17/2022   MCV 98.4 11/17/2022   MCH 31.3 05/04/2022   RDW 12.9 11/17/2022   PLT 168.0 11/17/2022   Last metabolic panel Lab Results  Component Value Date   GLUCOSE 96 05/18/2023   NA 139 05/18/2023   K 4.6 05/18/2023   CL 105 05/18/2023   CO2 29 05/18/2023   BUN 14 05/18/2023   CREATININE 1.17 05/18/2023   GFR 68.69 05/18/2023   CALCIUM 9.2 05/18/2023   PROT 6.9 05/18/2023   ALBUMIN 4.3 05/18/2023   BILITOT 0.6 05/18/2023   ALKPHOS 83 05/18/2023   AST 22 05/18/2023   ALT 31  05/18/2023   Last lipids Lab Results  Component Value Date   CHOL 149 05/18/2023   HDL 46.10 05/18/2023   LDLCALC 88 05/18/2023   TRIG 77.0 05/18/2023   CHOLHDL 3 05/18/2023   Last hemoglobin A1c No results found for: "HGBA1C" Last thyroid functions Lab Results  Component Value Date   TSH 1.27 11/17/2022   Last vitamin D Lab Results  Component Value Date   VD25OH 39.74 08/24/2021   Last vitamin B12 and Folate Lab Results  Component Value Date   VITAMINB12 936 (H) 08/24/2021      The 10-year ASCVD risk score (Arnett DK, et al., 2019) is: 10.4%    Assessment & Plan:   Problem List Items Addressed This Visit       Unprioritized   Hyperlipidemia   Tolerating statin, encouraged heart healthy diet, avoid trans fats, minimize simple carbs and saturated fats. Increase exercise as tolerated       Relevant Orders   Comprehensive metabolic panel (Completed)   Lipid panel (Completed)   Essential hypertension - Primary   Well controlled, no changes to meds. Encouraged heart healthy diet such as the DASH diet and exercise as tolerated.        Relevant Orders   Comprehensive metabolic panel (Completed)   Lipid panel (Completed)  POCT Urinalysis Dipstick (Automated)   Assessment and Plan    Weight Management   He gained 3 pounds since the last visit, possibly due to water weight or clothing. Previously, weight loss was attributed to increased physical activity at work. Dietary habits and challenges with fast food and high-sugar foods were discussed. He is considering increasing protein intake with protein shakes and reducing fast food consumption. A low-carb, high-protein diet with lean protein, vegetables, and healthy fats was recommended. Healthier fast food choices and the importance of protein for muscle maintenance were also discussed. Encourage a low-carb, high-protein diet and recommend protein shakes like [employer].  Hyperlipidemia   He reports no side  effects from current cholesterol medication, including muscle aches or joint pains. No ankle swelling is noted.  Creatine Supplementation   He inquired about creatine supplementation. It was discussed that creatine can cause abnormal lab results and potential kidney issues. Due to previous abnormal lab results, creatine use is advised against. The effects of creatine vary among individuals, and consulting a physician before use is important.  General Health Maintenance   He is overdue for a colonoscopy and plans to schedule it within the next month or two. The importance of regular screenings was discussed. Schedule a colonoscopy within the next month or two.  Follow-up   Perform blood work and urine test.       Return in about 6 months (around 11/18/2023), or if symptoms worsen or fail to improve, for annual exam, fasting.    Donato Schultz, DO

## 2023-05-18 NOTE — Assessment & Plan Note (Signed)
 Well controlled, no changes to meds. Encouraged heart healthy diet such as the DASH diet and exercise as tolerated.

## 2023-05-18 NOTE — Assessment & Plan Note (Addendum)
 Tolerating statin, encouraged heart healthy diet, avoid trans fats, minimize simple carbs and saturated fats. Increase exercise as tolerated

## 2023-05-18 NOTE — Patient Instructions (Signed)
 Cholesterol Content in Foods Cholesterol is a waxy, fat-like substance that helps to carry fat in the blood. The body needs cholesterol in small amounts, but too much cholesterol can cause damage to the arteries and heart. What foods have cholesterol?  Cholesterol is found in animal-based foods, such as meat, seafood, and dairy. Generally, low-fat dairy and lean meats have less cholesterol than full-fat dairy and fatty meats. The milligrams of cholesterol per serving (mg per serving) of common cholesterol-containing foods are listed below. Meats and other proteins Egg -- one large whole egg has 186 mg. Veal shank -- 4 oz (113 g) has 141 mg. Lean ground [place] (93% lean) -- 4 oz (113 g) has 118 mg. Fat-trimmed lamb loin -- 4 oz (113 g) has 106 mg. Lean ground beef (90% lean) -- 4 oz (113 g) has 100 mg. Lobster -- 3.5 oz (99 g) has 90 mg. Pork loin chops -- 4 oz (113 g) has 86 mg. Canned salmon -- 3.5 oz (99 g) has 83 mg. Fat-trimmed beef top loin -- 4 oz (113 g) has 78 mg. Frankfurter -- 1 frank (3.5 oz or 99 g) has 77 mg. Crab -- 3.5 oz (99 g) has 71 mg. Roasted chicken without skin, white meat -- 4 oz (113 g) has 66 mg. Light bologna -- 2 oz (57 g) has 45 mg. Deli-cut [place] -- 2 oz (57 g) has 31 mg. Canned tuna -- 3.5 oz (99 g) has 31 mg. Tomasa Blase -- 1 oz (28 g) has 29 mg. Oysters and mussels (raw) -- 3.5 oz (99 g) has 25 mg. Mackerel -- 1 oz (28 g) has 22 mg. Trout -- 1 oz (28 g) has 20 mg. Pork sausage -- 1 link (1 oz or 28 g) has 17 mg. Salmon -- 1 oz (28 g) has 16 mg. Tilapia -- 1 oz (28 g) has 14 mg. Dairy Soft-serve ice cream --  cup (4 oz or 86 g) has 103 mg. Whole-milk yogurt -- 1 cup (8 oz or 245 g) has 29 mg. Cheddar cheese -- 1 oz (28 g) has 28 mg. American cheese -- 1 oz (28 g) has 28 mg. Whole milk -- 1 cup (8 oz or 250 mL) has 23 mg. 2% milk -- 1 cup (8 oz or 250 mL) has 18 mg. Cream cheese -- 1 tablespoon (Tbsp) (14.5 g) has 15 mg. Cottage cheese --  cup (4 oz or  113 g) has 14 mg. Low-fat (1%) milk -- 1 cup (8 oz or 250 mL) has 10 mg. Sour cream -- 1 Tbsp (12 g) has 8.5 mg. Low-fat yogurt -- 1 cup (8 oz or 245 g) has 8 mg. Nonfat Greek yogurt -- 1 cup (8 oz or 228 g) has 7 mg. Half-and-half cream -- 1 Tbsp (15 mL) has 5 mg. Fats and oils Cod liver oil -- 1 tablespoon (Tbsp) (13.6 g) has 82 mg. Butter -- 1 Tbsp (14 g) has 15 mg. Lard -- 1 Tbsp (12.8 g) has 14 mg. Bacon grease -- 1 Tbsp (12.9 g) has 14 mg. Mayonnaise -- 1 Tbsp (13.8 g) has 5-10 mg. Margarine -- 1 Tbsp (14 g) has 3-10 mg. The items listed above may not be a complete list of foods with cholesterol. Exact amounts of cholesterol in these foods may vary depending on specific ingredients and brands. Contact a dietitian for more information. What foods do not have cholesterol? Most plant-based foods do not have cholesterol unless you combine them with a food that has  cholesterol. Foods without cholesterol include: Grains and cereals. Vegetables. Fruits. Vegetable oils, such as olive, canola, and sunflower oil. Legumes, such as peas, beans, and lentils. Nuts and seeds. Egg whites. The items listed above may not be a complete list of foods that do not have cholesterol. Contact a dietitian for more information. Summary The body needs cholesterol in small amounts, but too much cholesterol can cause damage to the arteries and heart. Cholesterol is found in animal-based foods, such as meat, seafood, and dairy. Generally, low-fat dairy and lean meats have less cholesterol than full-fat dairy and fatty meats. This information is not intended to replace advice given to you by your health care provider. Make sure you discuss any questions you have with your health care provider. Document Revised: 07/10/2020 Document Reviewed: 07/10/2020 Elsevier Patient Education  2024 [employer].
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
## 2023-11-20 ENCOUNTER — Ambulatory Visit (INDEPENDENT_AMBULATORY_CARE_PROVIDER_SITE_OTHER)

## 2023-11-20 ENCOUNTER — Encounter: Payer: Self-pay | Admitting: Family Medicine

## 2023-11-20 ENCOUNTER — Ambulatory Visit: Admitting: Family Medicine

## 2023-11-20 VITALS — BP 110/86 | HR 65 | Temp 98.4°F | Resp 16 | Ht 70.0 in | Wt 188.2 lb

## 2023-11-20 DIAGNOSIS — Z0000 Encounter for general adult medical examination without abnormal findings: Secondary | ICD-10-CM

## 2023-11-20 DIAGNOSIS — Z1211 Encounter for screening for malignant neoplasm of colon: Secondary | ICD-10-CM

## 2023-11-20 DIAGNOSIS — G8929 Other chronic pain: Secondary | ICD-10-CM | POA: Diagnosis not present

## 2023-11-20 DIAGNOSIS — I10 Essential (primary) hypertension: Secondary | ICD-10-CM | POA: Diagnosis not present

## 2023-11-20 DIAGNOSIS — M25561 Pain in right knee: Secondary | ICD-10-CM

## 2023-11-20 DIAGNOSIS — E785 Hyperlipidemia, unspecified: Secondary | ICD-10-CM | POA: Diagnosis not present

## 2023-11-20 DIAGNOSIS — R9389 Abnormal findings on diagnostic imaging of other specified body structures: Secondary | ICD-10-CM | POA: Diagnosis not present

## 2023-11-20 DIAGNOSIS — Z125 Encounter for screening for malignant neoplasm of prostate: Secondary | ICD-10-CM | POA: Diagnosis not present

## 2023-11-20 LAB — COMPREHENSIVE METABOLIC PANEL WITH GFR
ALT: 35 U/L (ref 0–53)
AST: 23 U/L (ref 0–37)
Albumin: 4.3 g/dL (ref 3.5–5.2)
Alkaline Phosphatase: 91 U/L (ref 39–117)
BUN: 17 mg/dL (ref 6–23)
CO2: 30 meq/L (ref 19–32)
Calcium: 9.3 mg/dL (ref 8.4–10.5)
Chloride: 106 meq/L (ref 96–112)
Creatinine, Ser: 1.3 mg/dL (ref 0.40–1.50)
GFR: 60.31 mL/min (ref >60.00)
Glucose, Bld: 96 mg/dL (ref 70–99)
Potassium: 4.4 meq/L (ref 3.5–5.1)
Sodium: 141 meq/L (ref 135–145)
Total Bilirubin: 0.6 mg/dL (ref 0.2–1.2)
Total Protein: 7.1 g/dL (ref 6.0–8.3)

## 2023-11-20 LAB — CBC WITH DIFFERENTIAL/PLATELET
Basophils Absolute: 0 K/uL (ref 0.0–0.1)
Basophils Relative: 1.1 % (ref 0.0–3.0)
Eosinophils Absolute: 0.1 K/uL (ref 0.0–0.7)
Eosinophils Relative: 3.6 % (ref 0.0–5.0)
HCT: 40.8 % (ref 39.0–52.0)
Hemoglobin: 13.6 g/dL (ref 13.0–17.0)
Lymphocytes Relative: 41.6 % (ref 12.0–46.0)
Lymphs Abs: 1.3 K/uL (ref 0.7–4.0)
MCHC: 33.4 g/dL (ref 30.0–36.0)
MCV: 95 fl (ref 78.0–100.0)
Monocytes Absolute: 0.4 K/uL (ref 0.1–1.0)
Monocytes Relative: 13.8 % — ABNORMAL HIGH (ref 3.0–12.0)
Neutro Abs: 1.2 K/uL — ABNORMAL LOW (ref 1.4–7.7)
Neutrophils Relative %: 39.9 % — ABNORMAL LOW (ref 43.0–77.0)
Platelets: 179 K/uL (ref 150.0–400.0)
RBC: 4.29 Mil/uL (ref 4.22–5.81)
RDW: 13.2 % (ref 11.5–15.5)
WBC: 3 K/uL — ABNORMAL LOW (ref 4.0–10.5)

## 2023-11-20 LAB — LIPID PANEL
Cholesterol: 150 mg/dL (ref 0–200)
HDL: 47.1 mg/dL (ref >39.00)
LDL Cholesterol: 87 mg/dL (ref 0–99)
NonHDL: 102.52
Total CHOL/HDL Ratio: 3
Triglycerides: 80 mg/dL (ref 0.0–149.0)
VLDL: 16 mg/dL (ref 0.0–40.0)

## 2023-11-20 LAB — TSH: TSH: 1.08 u[IU]/mL (ref 0.35–5.50)

## 2023-11-20 LAB — PSA: PSA: 2.3 ng/mL (ref 0.10–4.00)

## 2023-11-20 MED ORDER — ROSUVASTATIN CALCIUM 10 MG PO TABS: 10.0000 mg | ORAL_TABLET | Freq: Every day | ORAL | 1 refills | Status: AC

## 2023-11-20 MED ORDER — METOPROLOL SUCCINATE ER 100 MG PO TB24: 100.0000 mg | ORAL_TABLET | Freq: Every day | ORAL | 1 refills | Status: AC

## 2023-11-20 MED ORDER — AMLODIPINE BESYLATE 5 MG PO TABS: 5.0000 mg | ORAL_TABLET | Freq: Every day | ORAL | 2 refills | Status: AC

## 2023-11-20 NOTE — Assessment & Plan Note (Signed)
 Ghm utd Check labs  See AVS  Health Maintenance  Topic Date Due   HIV Screening  Never done   Hepatitis B Vaccines 19-59 Average Risk (1 of 3 - 19+ 3-dose series) Never done   Pneumococcal Vaccine: 50+ Years (1 of 1 - PCV) Never done   Zoster Vaccines- Shingrix (1 of 2) Never done   Colonoscopy  08/29/2022   COVID-19 Vaccine (3 - 2025-26 season) 11/13/2023   Influenza Vaccine  06/11/2024 (Originally 10/13/2023)   DTaP/Tdap/Td (2 - Td or Tdap) 06/27/2033   Hepatitis C Screening  Completed   HPV VACCINES  Aged Out   Meningococcal B Vaccine  Aged Out

## 2023-11-20 NOTE — Assessment & Plan Note (Signed)
 Well controlled, no changes to meds. Encouraged heart healthy diet such as the DASH diet and exercise as tolerated.
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
## 2023-11-20 NOTE — Assessment & Plan Note (Signed)
 Encourage heart healthy diet such as MIND or DASH diet, increase exercise, avoid trans fats, simple carbohydrates and processed foods, consider a krill or fish or flaxseed oil cap daily.

## 2023-11-21 ENCOUNTER — Ambulatory Visit: Payer: Self-pay | Admitting: Family Medicine

## 2023-12-18 ENCOUNTER — Ambulatory Visit (INDEPENDENT_AMBULATORY_CARE_PROVIDER_SITE_OTHER)

## 2023-12-18 VITALS — BP 128/84 | HR 80 | Temp 98.6°F | Ht 70.0 in | Wt 187.8 lb

## 2023-12-18 DIAGNOSIS — D72818 Other decreased white blood cell count: Secondary | ICD-10-CM

## 2023-12-18 DIAGNOSIS — R062 Wheezing: Secondary | ICD-10-CM

## 2023-12-18 DIAGNOSIS — R001 Bradycardia, unspecified: Secondary | ICD-10-CM

## 2023-12-18 DIAGNOSIS — R590 Localized enlarged lymph nodes: Secondary | ICD-10-CM | POA: Diagnosis not present

## 2023-12-18 LAB — VITAMIN D 25 HYDROXY (VIT D DEFICIENCY, FRACTURES): VITD: 26.38 ng/mL — ABNORMAL LOW (ref 30.00–100.00)

## 2023-12-18 LAB — SEDIMENTATION RATE: Sed Rate: 32 mm/h — ABNORMAL HIGH (ref 0–20)
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
## 2023-12-18 NOTE — Patient Instructions (Addendum)
 It was a pleasure to see you today. Please have your labs drawn in our clinic today. Your pulmonary function test will be scheduled closer to your next follow up visit. You should get your eye checked once a year for possible sarcoidosis. We will refer you to a heart doctor if your EKG results are abnormal.

## 2023-12-19 LAB — CBC WITH DIFFERENTIAL/PLATELET
Basophils Absolute: 0.1 K/uL (ref 0.0–0.1)
Basophils Relative: 2.1 % (ref 0.0–3.0)
Eosinophils Absolute: 0.1 K/uL (ref 0.0–0.7)
Eosinophils Relative: 3 % (ref 0.0–5.0)
HCT: 41.2 % (ref 39.0–52.0)
Hemoglobin: 13.9 g/dL (ref 13.0–17.0)
Lymphocytes Relative: 43.5 % (ref 12.0–46.0)
Lymphs Abs: 1.3 K/uL (ref 0.7–4.0)
MCHC: 33.8 g/dL (ref 30.0–36.0)
MCV: 95.5 fl (ref 78.0–100.0)
Monocytes Absolute: 0.4 K/uL (ref 0.1–1.0)
Monocytes Relative: 12.8 % — ABNORMAL HIGH (ref 3.0–12.0)
Neutro Abs: 1.1 K/uL — ABNORMAL LOW (ref 1.4–7.7)
Neutrophils Relative %: 38.6 % — ABNORMAL LOW (ref 43.0–77.0)
Platelets: 187 K/uL (ref 150.0–400.0)
RBC: 4.31 Mil/uL (ref 4.22–5.81)
RDW: 13.6 % (ref 11.5–15.5)
WBC: 3 K/uL — ABNORMAL LOW (ref 4.0–10.5)

## 2023-12-19 LAB — CALCIUM: Calcium: 9.5 mg/dL (ref 8.4–10.5)

## 2023-12-22 LAB — ANGIOTENSIN CONVERTING ENZYME: Angiotensin-Converting Enzyme: 48 U/L (ref 9–67)

## 2023-12-22 LAB — QUANTIFERON-TB GOLD PLUS
Mitogen-NIL: >10 [IU]/mL
NIL: 0.08 [IU]/mL
QuantiFERON-TB Gold Plus: NEGATIVE
TB1-NIL: <0 [IU]/mL
TB2-NIL: <0 [IU]/mL

## 2023-12-22 LAB — IGE: IgE (Immunoglobulin E), Serum: 296 kU/L — ABNORMAL HIGH (ref <=114)

## 2023-12-22 LAB — VITAMIN D 1,25 DIHYDROXY
Vitamin D 1, 25 (OH)2 Total: 49 pg/mL (ref 18–72)
Vitamin D2 1, 25 (OH)2: <8 pg/mL
Vitamin D3 1, 25 (OH)2: 49 pg/mL

## 2023-12-22 LAB — ANTI-NUCLEAR AB-TITER (ANA TITER): ANA Titer 1: 1:40 {titer} — ABNORMAL HIGH

## 2023-12-22 LAB — RHEUMATOID FACTOR: Rheumatoid fact SerPl-aCnc: <10 [IU]/mL (ref <14)

## 2023-12-22 LAB — ANA: Anti Nuclear Antibody (ANA): POSITIVE — AB

## 2023-12-30 ENCOUNTER — Ambulatory Visit: Payer: Self-pay

## 2024-01-01 ENCOUNTER — Other Ambulatory Visit: Payer: Self-pay

## 2024-01-01 DIAGNOSIS — R062 Wheezing: Secondary | ICD-10-CM

## 2024-01-01 DIAGNOSIS — R590 Localized enlarged lymph nodes: Secondary | ICD-10-CM

## 2024-02-26 ENCOUNTER — Telehealth: Payer: Self-pay | Admitting: Hematology and Oncology

## 2024-02-26 ENCOUNTER — Inpatient Hospital Stay: Admitting: Hematology and Oncology

## 2024-02-26 ENCOUNTER — Inpatient Hospital Stay

## 2024-02-26 ENCOUNTER — Telehealth: Payer: Self-pay

## 2024-02-26 NOTE — Telephone Encounter (Signed)
 Rescheduled patient Np Appointment from 02/26/24 to 03/28/24

## 2024-02-26 NOTE — Telephone Encounter (Signed)
 TC to pt as not present for appointments. Pt declared he canceled last week yet does want to be rescheduled. Message sent to scheduling for appt request.

## 2024-03-04 ENCOUNTER — Encounter: Payer: Self-pay | Admitting: Internal Medicine

## 2024-03-26 NOTE — Progress Notes (Unsigned)
 "  Office Visit Note  Patient: Rihan Schueler             Date of Birth: 04/13/1965           MRN: [id number]             PCP: Antonio Cyndee Jamee JONELLE, DO Referring: Pleas Newborn, MD Visit Date: 04/03/2024 Occupation: Data Unavailable  Subjective:  No chief complaint on file.   History of Present Illness: Nimai Burbach is a 59 y.o. male ***     Activities of Daily Living:  Patient reports morning stiffness for 0 minutes.   Patient Denies nocturnal pain.  Difficulty dressing/grooming: Denies Difficulty climbing stairs: Denies Difficulty getting out of chair: Denies Difficulty using hands for taps, buttons, cutlery, and/or writing: Denies  Review of Systems  Constitutional:  Positive for fatigue.  HENT:  Negative for mouth sores and mouth dryness.   Eyes:  Negative for dryness.  Respiratory:  Negative for shortness of breath.   Cardiovascular:  Negative for chest pain and palpitations.  Gastrointestinal:  Negative for blood in stool, constipation and diarrhea.  Endocrine: Negative for increased urination.  Genitourinary:  Negative for involuntary urination.  Musculoskeletal:  Negative for joint pain, gait problem, joint pain, joint swelling, myalgias, muscle weakness, morning stiffness, muscle tenderness and myalgias.  Skin:  Negative for color change, rash, hair loss and sensitivity to sunlight.  Allergic/Immunologic: Negative for susceptible to infections.  Neurological:  Negative for dizziness and headaches.  Hematological:  Negative for swollen glands.  Psychiatric/Behavioral:  Positive for depressed mood and sleep disturbance. The patient is not nervous/anxious.     PMFS History:  Patient Active Problem List   Diagnosis Date Noted   Chronic pain of right knee 05/18/2021   Viral upper respiratory tract infection 02/16/2021   Hyperlipidemia 10/01/2019   Essential hypertension 05/27/2019   Preventative health care 05/27/2019   Contusion of right foot 09/12/2018   Right  foot pain 08/10/2018   BACK PAIN WITH RADICULOPATHY 09/18/2009    Past Medical History:  Diagnosis Date   Allergy    no meds    Hypertension 2021    Family History  Problem Relation Age of Onset   Hypertension Mother    Hypertension Father    Diabetes Paternal Grandmother    Hypertension Paternal Grandmother    Prostate cancer Maternal Uncle    Colon cancer Neg Hx    Colon polyps Neg Hx    Esophageal cancer Neg Hx    Rectal cancer Neg Hx    Stomach cancer Neg Hx    Past Surgical History:  Procedure Laterality Date   WISDOM TOOTH EXTRACTION     Social History[1] Social History   Social History Narrative   Exercising ---  no      Immunization History  Administered Date(s) Administered   PFIZER(Purple Top)SARS-COV-2 Vaccination 05/30/2019, 06/24/2019   Tdap 06/28/2023     Objective: Vital Signs: There were no vitals taken for this visit.   Physical Exam   Musculoskeletal Exam: ***  CDAI Exam: CDAI Score: -- Patient Global: --; Provider Global: -- Swollen: --; Tender: -- Joint Exam 04/03/2024   No joint exam has been documented for this visit   There is currently no information documented on the homunculus. Go to the Rheumatology activity and complete the homunculus joint exam.  Investigation: No additional findings.  Imaging: No results found.  Recent Labs: Lab Results  Component Value Date   WBC 3.0 (L) 12/18/2023   HGB 13.9  12/18/2023   PLT 187.0 12/18/2023   NA 141 11/20/2023   K 4.4 11/20/2023   CL 106 11/20/2023   CO2 30 11/20/2023   GLUCOSE 96 11/20/2023   BUN 17 11/20/2023   CREATININE 1.30 11/20/2023   BILITOT 0.6 11/20/2023   ALKPHOS 91 11/20/2023   AST 23 11/20/2023   ALT 35 11/20/2023   PROT 7.1 11/20/2023   ALBUMIN 4.3 11/20/2023   CALCIUM  9.5 12/18/2023   QFTBGOLDPLUS NEGATIVE 12/18/2023    Speciality Comments: No specialty comments available.  Procedures:  No procedures performed Allergies: Patient has no known  allergies.   Assessment / Plan:     Visit Diagnoses: No diagnosis found.  Orders: No orders of the defined types were placed in this encounter.  No orders of the defined types were placed in this encounter.   Face-to-face time spent with patient was *** minutes. Greater than 50% of time was spent in counseling and coordination of care.  Follow-Up Instructions: No follow-ups on file.   Alfonso Patterson, LPN  Note - This record has been created using [employer].  Chart creation errors have been sought, but may not always  have been located. Such creation errors do not reflect on  the standard of medical care.     [1]  Social History Tobacco Use   Smoking status: Never   Smokeless tobacco: Never  Substance Use Topics   Alcohol use: Yes    Comment: occ    Drug use: Never   "

## 2024-03-28 ENCOUNTER — Inpatient Hospital Stay: Attending: Hematology and Oncology | Admitting: Hematology and Oncology

## 2024-03-28 ENCOUNTER — Inpatient Hospital Stay

## 2024-03-28 NOTE — Progress Notes (Unsigned)
 " [facility] [phone]   [phone]  INITIAL CONSULT NOTE  Patient Care Team: Antonio Meth, Jamee SAUNDERS, DO as PCP - General  Hematological/Oncological History # Mediastinal Lymphadenopathy  01/01/2024: visit with pulmonology. Biopsy offered for lymphadenopathy but declined.  03/28/2024: establish care with Dr. Federico   CHIEF COMPLAINTS/PURPOSE OF CONSULTATION:  Mediastinal Lymphadenopathy    HISTORY OF PRESENTING ILLNESS:  William Lawrence 59 y.o. male with medical history significant for ***  On review of the previous records ***  On exam today ***  MEDICAL HISTORY:  Past Medical History:  Diagnosis Date   Allergy    no meds    Hypertension 2021    SURGICAL HISTORY: Past Surgical History:  Procedure Laterality Date   WISDOM TOOTH EXTRACTION      SOCIAL HISTORY: Social History   Socioeconomic History   Marital status: Married    Spouse name: Not on file   Number of children: Not on file   Years of education: Not on file   Highest education level: Not on file  Occupational History    Comment: self employed  Tobacco Use   Smoking status: Never   Smokeless tobacco: Never  Substance and Sexual Activity   Alcohol use: Yes    Comment: occ    Drug use: Never   Sexual activity: Yes  Other Topics Concern   Not on file  Social History Narrative   Exercising ---  no    Social Drivers of Health   Tobacco Use: Low Risk (02/21/2024)   Received from [facility]   Patient History    Smoking Tobacco Use: Never    Smokeless Tobacco Use: Never    Passive Exposure: Not on file  Financial Resource Strain: Not on file  Food Insecurity: Low Risk (03/13/2024)   Received from [facility]   Epic    Within the past 12 months, you worried that your food would run out before you got money to buy more: Never true    Within the past 12 months, the food you bought just didn't last and you didn't have money to get more. : Never true   Transportation Needs: No Transportation Needs (03/13/2024)   Received from [employer]    In the past 12 months, has lack of reliable transportation kept you from medical appointments, meetings, work or from getting things needed for daily living? : No  Physical Activity: Not on file  Stress: Not on file  Social Connections: Not on file  Intimate Partner Violence: Not on file  Depression (PHQ2-9): Low Risk (11/20/2023)   Depression (PHQ2-9)    PHQ-2 Score: 0  Alcohol Screen: Not on file  Housing: Low Risk (03/13/2024)   Received from [facility]   Epic    What is your living situation today?: I have a steady place to live    Think about the place you live. Do you have problems with any of the following? Choose all that apply:: None/None on this list  Utilities: Low Risk (03/13/2024)   Received from [facility]   Utilities    In the past 12 months has the electric, gas, oil, or water company threatened to shut off services in your home? : No  Health Literacy: Not on file    FAMILY HISTORY: Family History  Problem Relation Age of Onset   Hypertension Mother    Hypertension Father    Diabetes Paternal Grandmother    Hypertension Paternal Grandmother  Prostate cancer Maternal Uncle    Colon cancer Neg Hx    Colon polyps Neg Hx    Esophageal cancer Neg Hx    Rectal cancer Neg Hx    Stomach cancer Neg Hx     ALLERGIES:  has no known allergies.  MEDICATIONS:  Current Outpatient Medications  Medication Sig Dispense Refill   amLODipine  (NORVASC ) 5 MG tablet Take 1 tablet (5 mg total) by mouth daily. 90 tablet 2   levocetirizine (XYZAL ) 5 MG tablet TAKE 1 TABLET BY MOUTH EVERY DAY IN THE EVENING 90 tablet 0   metoprolol  succinate (TOPROL -XL) 100 MG 24 hr tablet Take 1 tablet (100 mg total) by mouth daily. TAKE WITH OR IMMEDIATELY FOLLOWING A MEAL. 90 tablet 1   rosuvastatin  (CRESTOR ) 10 MG tablet Take 1 tablet (10 mg total) by mouth daily. 90 tablet 1    No current facility-administered medications for this visit.    REVIEW OF SYSTEMS:   Constitutional: ( - ) fevers, ( - )  chills , ( - ) night sweats Eyes: ( - ) blurriness of vision, ( - ) double vision, ( - ) watery eyes Ears, nose, mouth, throat, and face: ( - ) mucositis, ( - ) sore throat Respiratory: ( - ) cough, ( - ) dyspnea, ( - ) wheezes Cardiovascular: ( - ) palpitation, ( - ) chest discomfort, ( - ) lower extremity swelling Gastrointestinal:  ( - ) nausea, ( - ) heartburn, ( - ) change in bowel habits Skin: ( - ) abnormal skin rashes Lymphatics: ( - ) new lymphadenopathy, ( - ) easy bruising Neurological: ( - ) numbness, ( - ) tingling, ( - ) new weaknesses Behavioral/Psych: ( - ) mood change, ( - ) new changes  All other systems were reviewed with the patient and are negative.  PHYSICAL EXAMINATION:  There were no vitals filed for this visit. There were no vitals filed for this visit.  GENERAL: well appearing *** in NAD  SKIN: skin color, texture, turgor are normal, no rashes or significant lesions EYES: conjunctiva are pink and non-injected, sclera clear LUNGS: clear to auscultation and percussion with normal breathing effort HEART: regular rate & rhythm and no murmurs and no lower extremity edema Musculoskeletal: no cyanosis of digits and no clubbing  PSYCH: alert & oriented x 3, fluent speech NEURO: no focal motor/sensory deficits  LABORATORY DATA:  I have reviewed the data as listed    Latest Ref Rng & Units 12/18/2023   11:35 AM 11/20/2023   10:59 AM 11/17/2022   10:13 AM  CBC  WBC 4.0 - 10.5 K/uL 3.0  3.0  3.8   Hemoglobin 13.0 - 17.0 g/dL 86.0  86.3  86.2   Hematocrit 39.0 - 52.0 % 41.2  40.8  42.6   Platelets 150.0 - 400.0 K/uL 187.0  179.0  168.0        Latest Ref Rng & Units 12/18/2023   11:35 AM 11/20/2023   10:59 AM 05/18/2023   10:26 AM  CMP  Glucose 70 - 99 mg/dL  96  96   BUN 6 - 23 mg/dL  17  14   Creatinine 9.59 - 1.50 mg/dL  8.69  8.82    Sodium 864 - 145 mEq/L  141  139   Potassium 3.5 - 5.1 mEq/L  4.4  4.6   Chloride 96 - 112 mEq/L  106  105   CO2 19 - 32 mEq/L  30  29   Calcium  8.4 -  10.5 mg/dL 9.5  9.3  9.2   Total Protein 6.0 - 8.3 g/dL  7.1  6.9   Total Bilirubin 0.2 - 1.2 mg/dL  0.6  0.6   Alkaline Phos 39 - 117 U/L  91  83   AST 0 - 37 U/L  23  22   ALT 0 - 53 U/L  35  31      ASSESSMENT & PLAN ***  After review of the labs, review of the records, and discussion with the patient the patients findings are most consistent with ***  No orders of the defined types were placed in this encounter.   All questions were answered. The patient knows to call the clinic with any problems, questions or concerns.  A total of more than 60 minutes were spent on this encounter with face-to-face time and non-face-to-face time, including preparing to see the patient, ordering tests and/or medications, counseling the patient and coordination of care as outlined above.   Norleen IVAR Kidney, MD Department of Hematology/Oncology [facility] Phone: [phone] Pager: [phone] Email: [email or web address]  03/28/2024 8:36 AM  "

## 2024-04-03 ENCOUNTER — Ambulatory Visit

## 2024-04-03 VITALS — BP 134/87 | HR 51 | Temp 98.4°F | Resp 12 | Ht 69.0 in | Wt 191.6 lb

## 2024-04-03 DIAGNOSIS — M2460 Ankylosis, unspecified joint: Secondary | ICD-10-CM

## 2024-04-03 DIAGNOSIS — R700 Elevated erythrocyte sedimentation rate: Secondary | ICD-10-CM | POA: Diagnosis not present

## 2024-04-03 DIAGNOSIS — D709 Neutropenia, unspecified: Secondary | ICD-10-CM

## 2024-04-03 DIAGNOSIS — R7689 Other specified abnormal immunological findings in serum: Secondary | ICD-10-CM

## 2024-04-03 DIAGNOSIS — R591 Generalized enlarged lymph nodes: Secondary | ICD-10-CM | POA: Diagnosis not present

## 2024-04-04 ENCOUNTER — Telehealth: Payer: Self-pay

## 2024-04-04 NOTE — Telephone Encounter (Signed)
 PT scheduled for apt.

## 2024-04-04 NOTE — Telephone Encounter (Signed)
-----   Message from Carter Helling, MD sent at 04/04/2024  9:44 AM EST ----- Thank you for your message. Yes, of course. He was unwilling to proceed with bx in the last visit, but he may be more open to it now. We will bring him in to the clinic to discuss it again. Admin, please set up an appointment with me in next 2-3 weeks.  DB ----- Message ----- From: Luba Stabs, DO Sent: 04/04/2024   9:25 AM EST To: Carter Helling, MD  Hi Dr. Helling, My name is Stabs Luba, I am one of the Rheumatologist at [facility]. I am reaching out regarding mutual patient, William Lawrence. Given his current work-up for sarcoidosis has been overwhelmingly negative, I think the next best step would be to obtain a lymph node biopsy. We really need to get a better look at the pathology to see if it resembles a sarcoidosis or alternative process. I discussed this with William Lawrence and he is agreeable. Would this be something that could be arranged?  Thank you, Stabs

## 2024-04-09 LAB — PROTEIN ELECTROPHORESIS, SERUM
Albumin ELP: 4.3 g/dL (ref 3.8–4.8)
Alpha 1: 0.3 g/dL (ref 0.2–0.3)
Alpha 2: 0.6 g/dL (ref 0.5–0.9)
Beta 2: 0.5 g/dL (ref 0.2–0.5)
Beta Globulin: 0.5 g/dL (ref 0.4–0.6)
Gamma Globulin: 1.2 g/dL (ref 0.8–1.7)
Total Protein: 7.4 g/dL (ref 6.1–8.1)

## 2024-04-09 LAB — C3 AND C4
C3 Complement: 138 mg/dL (ref 82–185)
C4 Complement: 33 mg/dL (ref 15–53)

## 2024-04-09 LAB — RNP ANTIBODY: Ribonucleic Protein(ENA) Antibody, IgG: <1 AI

## 2024-04-09 LAB — IFE INTERPRETATION

## 2024-04-09 LAB — SJOGREN'S SYNDROME ANTIBODS(SSA + SSB)
SSA (Ro) (ENA) Antibody, IgG: <1 AI
SSB (La) (ENA) Antibody, IgG: <1 AI

## 2024-04-09 LAB — IGG, IGA, IGM
IgG (Immunoglobin G), Serum: 1411 mg/dL (ref 600–1640)
IgM, Serum: 90 mg/dL (ref 50–300)
Immunoglobulin A: 397 mg/dL — ABNORMAL HIGH (ref 47–310)

## 2024-04-09 LAB — PROTEIN / CREATININE RATIO, URINE
Creatinine, Urine: 203 mg/dL (ref 20–320)
Protein/Creat Ratio: 84 mg/g{creat} (ref 25–148)
Protein/Creatinine Ratio: 0.084 mg/mg{creat} (ref 0.025–0.148)
Total Protein, Urine: 17 mg/dL (ref 5–25)

## 2024-04-09 LAB — IGG SUBCLASS 4: IgG Subclass 4: 58.6 mg/dL (ref 4.0–86.0)

## 2024-04-09 LAB — KAPPA/LAMBDA LIGHT CHAINS
Kappa free light chain: 20.6 mg/L — ABNORMAL HIGH (ref 3.3–19.4)
Kappa:Lambda Ratio: 1.29 (ref 0.26–1.65)
Lambda Free Lght Chn: 16 mg/L (ref 5.7–26.3)

## 2024-04-09 LAB — HLA-B27 ANTIGEN: HLA-B27 Antigen: NEGATIVE

## 2024-04-09 LAB — C-REACTIVE PROTEIN: CRP: <3 mg/L

## 2024-04-09 LAB — ANTI-DNA ANTIBODY, DOUBLE-STRANDED: ds DNA Ab: <1 [IU]/mL

## 2024-04-09 LAB — ANCA SCREEN W REFLEX TITER: ANCA SCREEN: NEGATIVE

## 2024-04-09 LAB — ANTI-SMITH ANTIBODY: ENA SM Ab Ser-aCnc: <1 AI

## 2024-04-22 ENCOUNTER — Ambulatory Visit
